# Patient Record
Sex: Male | Born: 1945 | Race: White | Hispanic: No | Smoking: Current every day smoker
Health system: Southern US, Community
[De-identification: ages and names within clinical notes are randomized; demographics above are authoritative.]

## PROBLEM LIST (undated history)

## (undated) DIAGNOSIS — K228 Other specified diseases of esophagus: Secondary | ICD-10-CM

## (undated) DIAGNOSIS — E785 Hyperlipidemia, unspecified: Secondary | ICD-10-CM

## (undated) DIAGNOSIS — I10 Essential (primary) hypertension: Secondary | ICD-10-CM

## (undated) DIAGNOSIS — C4491 Basal cell carcinoma of skin, unspecified: Secondary | ICD-10-CM

## (undated) HISTORY — PX: MOHS SURGERY: SUR867

---

## 2011-05-13 DIAGNOSIS — E785 Hyperlipidemia, unspecified: Secondary | ICD-10-CM | POA: Insufficient documentation

## 2011-05-13 DIAGNOSIS — Z720 Tobacco use: Secondary | ICD-10-CM | POA: Insufficient documentation

## 2012-02-18 DIAGNOSIS — Z85828 Personal history of other malignant neoplasm of skin: Secondary | ICD-10-CM | POA: Insufficient documentation

## 2015-06-21 DIAGNOSIS — N5201 Erectile dysfunction due to arterial insufficiency: Secondary | ICD-10-CM | POA: Insufficient documentation

## 2015-10-24 DIAGNOSIS — Z0000 Encounter for general adult medical examination without abnormal findings: Secondary | ICD-10-CM | POA: Insufficient documentation

## 2015-12-26 ENCOUNTER — Encounter: Payer: Self-pay | Admitting: *Deleted

## 2015-12-27 ENCOUNTER — Ambulatory Visit: Payer: Medicare Other | Admitting: Anesthesiology

## 2015-12-27 ENCOUNTER — Encounter
Admission: RE | Disposition: A | Discharge status: Home / Self Care | Payer: Self-pay | Source: Ambulatory Visit | Attending: Unknown Physician Specialty

## 2015-12-27 ENCOUNTER — Encounter: Payer: Self-pay | Admitting: *Deleted

## 2015-12-27 ENCOUNTER — Ambulatory Visit
Admission: RE | Admit: 2015-12-27 | Discharge: 2015-12-27 | Disposition: A | Discharge status: Home / Self Care | Payer: Medicare Other | Source: Ambulatory Visit | Attending: Unknown Physician Specialty | Admitting: Unknown Physician Specialty

## 2015-12-27 DIAGNOSIS — Z1211 Encounter for screening for malignant neoplasm of colon: Secondary | ICD-10-CM | POA: Insufficient documentation

## 2015-12-27 DIAGNOSIS — I10 Essential (primary) hypertension: Secondary | ICD-10-CM | POA: Diagnosis not present

## 2015-12-27 DIAGNOSIS — Z85828 Personal history of other malignant neoplasm of skin: Secondary | ICD-10-CM | POA: Diagnosis not present

## 2015-12-27 DIAGNOSIS — Z79899 Other long term (current) drug therapy: Secondary | ICD-10-CM | POA: Insufficient documentation

## 2015-12-27 DIAGNOSIS — K5730 Diverticulosis of large intestine without perforation or abscess without bleeding: Secondary | ICD-10-CM | POA: Insufficient documentation

## 2015-12-27 DIAGNOSIS — K640 First degree hemorrhoids: Secondary | ICD-10-CM | POA: Diagnosis not present

## 2015-12-27 DIAGNOSIS — Z7982 Long term (current) use of aspirin: Secondary | ICD-10-CM | POA: Diagnosis not present

## 2015-12-27 DIAGNOSIS — F17200 Nicotine dependence, unspecified, uncomplicated: Secondary | ICD-10-CM | POA: Insufficient documentation

## 2015-12-27 DIAGNOSIS — K621 Rectal polyp: Secondary | ICD-10-CM | POA: Diagnosis not present

## 2015-12-27 DIAGNOSIS — E785 Hyperlipidemia, unspecified: Secondary | ICD-10-CM | POA: Diagnosis not present

## 2015-12-27 DIAGNOSIS — R011 Cardiac murmur, unspecified: Secondary | ICD-10-CM | POA: Diagnosis not present

## 2015-12-27 HISTORY — DX: Basal cell carcinoma of skin, unspecified: C44.91

## 2015-12-27 HISTORY — PX: COLONOSCOPY WITH PROPOFOL: SHX5780

## 2015-12-27 HISTORY — DX: Hyperlipidemia, unspecified: E78.5

## 2015-12-27 HISTORY — DX: Essential (primary) hypertension: I10

## 2015-12-27 SURGERY — COLONOSCOPY WITH PROPOFOL
Anesthesia: General

## 2015-12-27 MED ORDER — SODIUM CHLORIDE 0.9 % IV SOLN
INTRAVENOUS | Status: DC
  Administered 2015-12-27: 08:00:00 via INTRAVENOUS

## 2015-12-27 MED ORDER — PROPOFOL 10 MG/ML IV BOLUS
INTRAVENOUS | Status: DC | PRN
  Administered 2015-12-27: 30 mg via INTRAVENOUS
  Administered 2015-12-27: 20 mg via INTRAVENOUS

## 2015-12-27 MED ORDER — FENTANYL CITRATE (PF) 100 MCG/2ML IJ SOLN
INTRAMUSCULAR | Status: DC | PRN
  Administered 2015-12-27: 50 ug via INTRAVENOUS

## 2015-12-27 MED ORDER — MIDAZOLAM HCL 5 MG/5ML IJ SOLN
INTRAMUSCULAR | Status: DC | PRN
  Administered 2015-12-27: 1 mg via INTRAVENOUS

## 2015-12-27 MED ORDER — SODIUM CHLORIDE 0.9 % IV SOLN: INTRAVENOUS | Status: DC

## 2015-12-27 MED ORDER — LIDOCAINE HCL (PF) 2 % IJ SOLN
INTRAMUSCULAR | Status: DC | PRN
  Administered 2015-12-27: 50 mg

## 2015-12-27 MED ORDER — PROPOFOL 500 MG/50ML IV EMUL
INTRAVENOUS | Status: DC | PRN
  Administered 2015-12-27: 75 ug/kg/min via INTRAVENOUS

## 2015-12-27 NOTE — Transfer of Care (Signed)
Immediate Anesthesia Transfer of Care Note  Patient: Evan Mason  Procedure(s) Performed: Procedure(s): COLONOSCOPY WITH PROPOFOL (N/A)  Patient Location: PACU  Anesthesia Type:General  Level of Consciousness: sedated  Airway & Oxygen Therapy: Patient Spontanous Breathing and Patient connected to nasal cannula oxygen  Post-op Assessment: Report given to RN and Post -op Vital signs reviewed and stable  Post vital signs: Reviewed and stable  Last Vitals:  Filed Vitals:   12/27/15 0742  BP: 127/57  Pulse: 84  Temp: 35.7 C  Resp: 18    Complications: No apparent anesthesia complications

## 2015-12-27 NOTE — H&P (Signed)
   Primary Care Physician:  Kirk Ruths., MD Primary Gastroenterologist:  Dr. Vira Agar  Pre-Procedure History & Physical: HPI:  Evan Mason is a 70 y.o. male is here for an colonoscopy.   Past Medical History  Diagnosis Date  . Hypertension   . Hyperlipidemia   . Basal cell carcinoma     Past Surgical History  Procedure Laterality Date  . Mohs surgery      Prior to Admission medications   Medication Sig Start Date End Date Taking? Authorizing Provider  amLODipine (NORVASC) 10 MG tablet Take 10 mg by mouth daily.   Yes Historical Provider, MD  aspirin 81 MG tablet Take 81 mg by mouth daily.   Yes Historical Provider, MD  hydrochlorothiazide (HYDRODIURIL) 25 MG tablet Take 25 mg by mouth daily.   Yes Historical Provider, MD  lisinopril (PRINIVIL,ZESTRIL) 40 MG tablet Take 40 mg by mouth daily.   Yes Historical Provider, MD  Multiple Vitamins-Minerals (MULTIVITAMIN WITH MINERALS) tablet Take 1 tablet by mouth daily.   Yes Historical Provider, MD  OMEGA-3 FATTY ACIDS PO Take by mouth.   Yes Historical Provider, MD  sildenafil (REVATIO) 20 MG tablet Take 20 mg by mouth 3 (three) times daily.   Yes Historical Provider, MD    Allergies as of 12/20/2015  . (Not on File)    History reviewed. No pertinent family history.  Social History   Social History  . Marital Status: Divorced    Spouse Name: N/A  . Number of Children: N/A  . Years of Education: N/A   Occupational History  . Not on file.   Social History Main Topics  . Smoking status: Current Every Day Smoker  . Smokeless tobacco: Never Used  . Alcohol Use: No  . Drug Use: No  . Sexual Activity: Not on file   Other Topics Concern  . Not on file   Social History Narrative    Review of Systems: See HPI, otherwise negative ROS  Physical Exam: BP 127/57 mmHg  Pulse 84  Temp(Src) 96.2 F (35.7 C) (Tympanic)  Resp 18  Ht 5\' 8"  (1.727 m)  Wt 72.576 kg (160 lb)  BMI 24.33 kg/m2  SpO2  100% General:   Alert,  pleasant and cooperative in NAD Head:  Normocephalic and atraumatic. Neck:  Supple; no masses or thyromegaly. Lungs:  Clear throughout to auscultation.    Heart:  Regular rate and rhythm. Abdomen:  Soft, nontender and nondistended. Normal bowel sounds, without guarding, and without rebound.   Neurologic:  Alert and  oriented x4;  grossly normal neurologically.  Impression/Plan: Evan Mason is here for an colonoscopy to be performed for screening  Risks, benefits, limitations, and alternatives regarding  colonoscopy have been reviewed with the patient.  Questions have been answered.  All parties agreeable.   Gaylyn Cheers, MD  12/27/2015, 8:38 AM

## 2015-12-27 NOTE — Anesthesia Preprocedure Evaluation (Addendum)
Anesthesia Evaluation  Patient identified by MRN, date of birth, ID band Patient awake    Reviewed: Allergy & Precautions, NPO status , Patient's Chart, lab work & pertinent test results  Airway Mallampati: II  TM Distance: >3 FB     Dental  (+) Chipped   Pulmonary Current Smoker,    Pulmonary exam normal        Cardiovascular hypertension, Pt. on medications Normal cardiovascular exam+ Valvular Problems/Murmurs   Heart murmur   Neuro/Psych negative neurological ROS  negative psych ROS   GI/Hepatic negative GI ROS, Neg liver ROS,   Endo/Other  negative endocrine ROS  Renal/GU negative Renal ROS  negative genitourinary   Musculoskeletal negative musculoskeletal ROS (+)   Abdominal Normal abdominal exam  (+)   Peds negative pediatric ROS (+)  Hematology negative hematology ROS (+)   Anesthesia Other Findings Basal cell  Reproductive/Obstetrics                            Anesthesia Physical Anesthesia Plan  ASA: II  Anesthesia Plan: General   Post-op Pain Management:    Induction: Intravenous  Airway Management Planned: Nasal Cannula  Additional Equipment:   Intra-op Plan:   Post-operative Plan:   Informed Consent: I have reviewed the patients History and Physical, chart, labs and discussed the procedure including the risks, benefits and alternatives for the proposed anesthesia with the patient or authorized representative who has indicated his/her understanding and acceptance.   Dental advisory given  Plan Discussed with: CRNA and Surgeon  Anesthesia Plan Comments:         Anesthesia Quick Evaluation

## 2015-12-27 NOTE — Op Note (Signed)
[facility] Gastroenterology Patient Name: Evan Mason Procedure Date: 12/27/2015 8:40 AM MRN: [id number] Account #: [id number] Date of Birth: 03-11-1946 Admit Type: Outpatient Age: 70 Room: [facility] ENDO ROOM 1 Gender: Male Note Status: Finalized Procedure:            Colonoscopy Indications:          Screening for colorectal malignant neoplasm Providers:            Manya Silvas, MD Referring MD:         Ocie Cornfield. Ouida Sills, MD (Referring MD) Medicines:            Propofol per Anesthesia Complications:        No immediate complications. Procedure:            Pre-Anesthesia Assessment:                       - After reviewing the risks and benefits, the patient                        was deemed in satisfactory condition to undergo the                        procedure.                       After obtaining informed consent, the colonoscope was                        passed under direct vision. Throughout the procedure,                        the patient's blood pressure, pulse, and oxygen                        saturations were monitored continuously. The                        Colonoscope was introduced through the anus and                        advanced to the the cecum, identified by appendiceal                        orifice and ileocecal valve. The colonoscopy was                        performed without difficulty. The patient tolerated the                        procedure well. The quality of the bowel preparation                        was excellent. Findings:      A small polyp was found in the rectum. The polyp was sessile. The polyp       was removed with a hot snare. Resection and retrieval were complete.      A few small-mouthed diverticula were found in the sigmoid colon.      Internal hemorrhoids were found during endoscopy. The hemorrhoids were       small and Grade I (internal hemorrhoids that do not prolapse).  The colon was otherwise  normal. Impression:           - One small polyp in the rectum, removed with a hot                        snare. Resected and retrieved.                       - Diverticulosis in the sigmoid colon.                       - Internal hemorrhoids. Recommendation:       - Await pathology results. Manya Silvas, MD 12/27/2015 9:06:29 AM This report has been signed electronically. Number of Addenda: 0 Note Initiated On: 12/27/2015 8:40 AM Scope Withdrawal Time: 0 hours 9 minutes 25 seconds  Total Procedure Duration: 0 hours 14 minutes 57 seconds       [facility]

## 2015-12-27 NOTE — Anesthesia Postprocedure Evaluation (Signed)
Anesthesia Post Note  Patient: Evan Mason  Procedure(s) Performed: Procedure(s) (LRB): COLONOSCOPY WITH PROPOFOL (N/A)  Patient location during evaluation: PACU Anesthesia Type: General Level of consciousness: awake and alert and oriented Pain management: pain level controlled Vital Signs Assessment: post-procedure vital signs reviewed and stable Respiratory status: spontaneous breathing Cardiovascular status: blood pressure returned to baseline Anesthetic complications: no    Last Vitals:  Filed Vitals:   12/27/15 0928 12/27/15 0930  BP: 142/76 130/71  Pulse: 79 74  Temp:    Resp: 19 16    Last Pain: There were no vitals filed for this visit.               Maddyx Wieck

## 2015-12-28 LAB — SURGICAL PATHOLOGY

## 2016-11-07 DIAGNOSIS — R739 Hyperglycemia, unspecified: Secondary | ICD-10-CM | POA: Insufficient documentation

## 2018-01-14 ENCOUNTER — Other Ambulatory Visit: Payer: Self-pay | Admitting: Gastroenterology

## 2018-01-14 DIAGNOSIS — R634 Abnormal weight loss: Secondary | ICD-10-CM

## 2018-01-14 DIAGNOSIS — R1310 Dysphagia, unspecified: Secondary | ICD-10-CM

## 2018-01-15 ENCOUNTER — Ambulatory Visit
Admission: RE | Admit: 2018-01-15 | Discharge: 2018-01-15 | Disposition: A | Discharge status: Home / Self Care | Payer: Medicare HMO | Source: Ambulatory Visit | Attending: Gastroenterology | Admitting: Gastroenterology

## 2018-01-15 DIAGNOSIS — K222 Esophageal obstruction: Secondary | ICD-10-CM | POA: Diagnosis not present

## 2018-01-15 DIAGNOSIS — R1310 Dysphagia, unspecified: Secondary | ICD-10-CM | POA: Insufficient documentation

## 2018-01-15 DIAGNOSIS — R634 Abnormal weight loss: Secondary | ICD-10-CM | POA: Diagnosis present

## 2018-01-19 ENCOUNTER — Other Ambulatory Visit: Payer: Self-pay | Admitting: Gastroenterology

## 2018-01-19 ENCOUNTER — Telehealth: Payer: Self-pay

## 2018-01-19 ENCOUNTER — Institutional Professional Consult (permissible substitution): Payer: Medicare HMO | Admitting: Radiation Oncology

## 2018-01-19 ENCOUNTER — Ambulatory Visit: Payer: Medicare HMO | Admitting: Anesthesiology

## 2018-01-19 ENCOUNTER — Other Ambulatory Visit (HOSPITAL_COMMUNITY): Payer: Self-pay | Admitting: Gastroenterology

## 2018-01-19 ENCOUNTER — Ambulatory Visit
Admission: RE | Admit: 2018-01-19 | Discharge: 2018-01-19 | Disposition: A | Discharge status: Home / Self Care | Payer: Medicare HMO | Source: Ambulatory Visit | Attending: Gastroenterology | Admitting: Gastroenterology

## 2018-01-19 ENCOUNTER — Encounter
Admission: RE | Disposition: A | Discharge status: Home / Self Care | Payer: Self-pay | Source: Ambulatory Visit | Attending: Gastroenterology

## 2018-01-19 ENCOUNTER — Encounter: Payer: Self-pay | Admitting: Emergency Medicine

## 2018-01-19 DIAGNOSIS — I10 Essential (primary) hypertension: Secondary | ICD-10-CM | POA: Diagnosis not present

## 2018-01-19 DIAGNOSIS — Z85828 Personal history of other malignant neoplasm of skin: Secondary | ICD-10-CM | POA: Insufficient documentation

## 2018-01-19 DIAGNOSIS — K228 Other specified diseases of esophagus: Secondary | ICD-10-CM | POA: Insufficient documentation

## 2018-01-19 DIAGNOSIS — Z7982 Long term (current) use of aspirin: Secondary | ICD-10-CM | POA: Diagnosis not present

## 2018-01-19 DIAGNOSIS — Z79899 Other long term (current) drug therapy: Secondary | ICD-10-CM | POA: Diagnosis not present

## 2018-01-19 DIAGNOSIS — R1310 Dysphagia, unspecified: Secondary | ICD-10-CM | POA: Diagnosis present

## 2018-01-19 DIAGNOSIS — F17200 Nicotine dependence, unspecified, uncomplicated: Secondary | ICD-10-CM | POA: Insufficient documentation

## 2018-01-19 DIAGNOSIS — K222 Esophageal obstruction: Secondary | ICD-10-CM | POA: Insufficient documentation

## 2018-01-19 DIAGNOSIS — K2289 Other specified disease of esophagus: Secondary | ICD-10-CM

## 2018-01-19 HISTORY — PX: ESOPHAGOGASTRODUODENOSCOPY (EGD) WITH PROPOFOL: SHX5813

## 2018-01-19 SURGERY — ESOPHAGOGASTRODUODENOSCOPY (EGD) WITH PROPOFOL
Anesthesia: General

## 2018-01-19 MED ORDER — PROPOFOL 500 MG/50ML IV EMUL
INTRAVENOUS | Status: DC | PRN
  Administered 2018-01-19: 200 ug/kg/min via INTRAVENOUS

## 2018-01-19 MED ORDER — LIDOCAINE HCL (CARDIAC) 20 MG/ML IV SOLN
INTRAVENOUS | Status: DC | PRN
  Administered 2018-01-19: 80 mg via INTRAVENOUS

## 2018-01-19 MED ORDER — SODIUM CHLORIDE 0.9 % IV SOLN
INTRAVENOUS | Status: DC
  Administered 2018-01-19: 14:00:00 via INTRAVENOUS

## 2018-01-19 MED ORDER — GLYCOPYRROLATE 0.2 MG/ML IJ SOLN
INTRAMUSCULAR | Status: AC
  Filled 2018-01-19: qty 1

## 2018-01-19 MED ORDER — METOPROLOL TARTRATE 5 MG/5ML IV SOLN
INTRAVENOUS | Status: AC
  Filled 2018-01-19: qty 5

## 2018-01-19 MED ORDER — PROPOFOL 10 MG/ML IV BOLUS
INTRAVENOUS | Status: DC | PRN
  Administered 2018-01-19: 80 mg via INTRAVENOUS

## 2018-01-19 MED ORDER — GLYCOPYRROLATE 0.2 MG/ML IJ SOLN
INTRAMUSCULAR | Status: DC | PRN
  Administered 2018-01-19 (×2): 0.1 mg via INTRAVENOUS

## 2018-01-19 MED ORDER — SODIUM CHLORIDE 0.9 % IV BOLUS
1000.0000 mL | Freq: Once | INTRAVENOUS | Status: AC
  Administered 2018-01-19: 1000 mL via INTRAVENOUS

## 2018-01-19 MED ORDER — PROPOFOL 500 MG/50ML IV EMUL
INTRAVENOUS | Status: AC
  Filled 2018-01-19: qty 50

## 2018-01-19 MED ORDER — LIDOCAINE HCL (PF) 2 % IJ SOLN
INTRAMUSCULAR | Status: AC
  Filled 2018-01-19: qty 10

## 2018-01-19 NOTE — Op Note (Signed)
[facility] Gastroenterology Patient Name: Evan Mason Procedure Date: 01/19/2018 1:52 PM MRN: [id number] Account #: [id number] Date of Birth: [birth year] Admit Type: Outpatient Age: 72 Room: [facility] ENDO ROOM 3 Gender: Male Note Status: Finalized Procedure:            Upper GI endoscopy Providers:            Lollie Sails, MD Referring MD:         Ocie Cornfield. Ouida Sills MD, MD (Referring MD) Medicines:            Monitored Anesthesia Care Complications:        No immediate complications. Procedure:            Pre-Anesthesia Assessment:                       - ASA Grade Assessment: III - A patient with severe                        systemic disease.                       After obtaining informed consent, the endoscope was                        passed under direct vision. Throughout the procedure,                        the patient's blood pressure, pulse, and oxygen                        saturations were monitored continuously. The Endoscope                        was introduced through the mouth, and advanced to the                        second part of duodenum. The upper GI endoscopy was                        performed with moderate difficulty due to a partially                        obstructing mass. Successful completion of the                        procedure was aided by withdrawing the scope and                        replacing with the neonatal endoscope. Findings:      The GIF H190 was introduced to the distal esophagus. There is an       infiltrating mass noted at to 2-3 cm above the region of the GE       junction. I was unable to pass this scope into the gastric vault due to       the partially obstructing mass. This scope was removed and a GIF XP180N       scope was introduced and advanced through the very narrow distal /GEJ       region into the gastric vault and introduced into the duodenal c-loop.       The  duodenum was normal in  appearance. The gastric body, antrum and       fundus appear normal, however there is a fungating mass noted protruding       from the bottom of the GE junction. Multiple biopsies were obtained with       the neonatal biopsy forcep. On removal of the scope there is noted a       very narrowed lumen at the GE junction with the mass extending from the       upper cardia/distal GE junction to several cm above the apparent opening       at the cardia. Biopsies were taken from this area as well.      The patient experianced some short runs of wide complex SVT and multiple       pvc. I had hoped to reintroduce the first scope for larger biopsies, but       since much of the lesion was in the narrow area and seen in retroflex in       the upper cardia, and in light of the change of rhythm, I decided to       hold. Patient remained otherwise stable. 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 Biopsies taken. Recommendation:       - Perform a PET-CT scan of the chest, abdomen and                        pelvis at the next available appointment.                       - Encourage po liquids supplements liberally.                       - Refer to an oncologist at the next available                        appointment. Procedure Code(s):    --- Professional ---                       [phone], Esophagogastroduodenoscopy, flexible, transoral;                        diagnostic, including collection of specimen(s) by                        brushing or washing, when performed (separate procedure) CPT copyright 2016 American [employer] Association. All rights reserved. The codes documented in this report are preliminary and upon coder review may  be revised to meet current compliance requirements. Lollie Sails, MD 01/19/2018 3:01:03 PM This report has been signed electronically. Number of Addenda: 0 Note Initiated On: 01/19/2018 1:52  PM      [facility]

## 2018-01-19 NOTE — Anesthesia Post-op Follow-up Note (Signed)
Anesthesia QCDR form completed.        

## 2018-01-19 NOTE — Anesthesia Preprocedure Evaluation (Signed)
Anesthesia Evaluation  Patient identified by MRN, date of birth, ID band Patient awake    Reviewed: Allergy & Precautions, H&P , NPO status , Patient's Chart, lab work & pertinent test results, reviewed documented beta blocker date and time   Airway Mallampati: II   Neck ROM: full    Dental  (+) Poor Dentition   Pulmonary neg pulmonary ROS, Current Smoker,    Pulmonary exam normal        Cardiovascular hypertension, negative cardio ROS Normal cardiovascular exam Rhythm:regular Rate:Normal     Neuro/Psych negative neurological ROS  negative psych ROS   GI/Hepatic negative GI ROS, Neg liver ROS,   Endo/Other  negative endocrine ROS  Renal/GU negative Renal ROS  negative genitourinary   Musculoskeletal   Abdominal   Peds  Hematology negative hematology ROS (+)   Anesthesia Other Findings Past Medical History: No date: Basal cell carcinoma No date: Hyperlipidemia No date: Hypertension Past Surgical History: 12/27/2015: COLONOSCOPY WITH PROPOFOL; N/A     Comment:  Procedure: COLONOSCOPY WITH PROPOFOL;  Surgeon: Manya Silvas, MD;  Location: [facility] ENDOSCOPY;  Service:               Endoscopy;  Laterality: N/A; No date: MOHS SURGERY BMI    Body Mass Index:  18.75 kg/m     Reproductive/Obstetrics negative OB ROS                             Anesthesia Physical Anesthesia Plan  ASA: III  Anesthesia Plan: General   Post-op Pain Management:    Induction:   PONV Risk Score and Plan:   Airway Management Planned:   Additional Equipment:   Intra-op Plan:   Post-operative Plan:   Informed Consent: I have reviewed the patients History and Physical, chart, labs and discussed the procedure including the risks, benefits and alternatives for the proposed anesthesia with the patient or authorized representative who has indicated his/her understanding and acceptance.    Dental Advisory Given  Plan Discussed with: CRNA  Anesthesia Plan Comments:         Anesthesia Quick Evaluation

## 2018-01-19 NOTE — Transfer of Care (Signed)
Immediate Anesthesia Transfer of Care Note  Patient: Evan Mason  Procedure(s) Performed: ESOPHAGOGASTRODUODENOSCOPY (EGD) WITH PROPOFOL (N/A )  Patient Location: PACU  Anesthesia Type:General  Level of Consciousness: awake  Airway & Oxygen Therapy: Patient Spontanous Breathing  Post-op Assessment: Report given to RN  Post vital signs: stable  Last Vitals:  Vitals Value Taken Time  BP 102/78 01/19/2018  2:52 PM  Temp 36.1 C 01/19/2018  2:52 PM  Pulse 90 01/19/2018  2:52 PM  Resp 20 01/19/2018  2:52 PM  SpO2 100 % 01/19/2018  2:52 PM  Vitals shown include unvalidated device data.  Last Pain:  Vitals:   01/19/18 1452  TempSrc: Tympanic  PainSc:          Complications: No apparent anesthesia complications. Discussed arrhythmias with PACU RN.

## 2018-01-19 NOTE — Telephone Encounter (Signed)
Provided contact information for [employer], significant other. Called and notified of appointments 4/1 starting at 0930 with Dr. Cristela Blue. Oncology Nurse Navigator Documentation  Navigator Location: [facility] (01/19/18 1557)   )Navigator Encounter Type: Telephone (01/19/18 1557) Telephone: Lahoma Crocker Call;Appt Confirmation/Clarification (01/19/18 1557)                                                  Time Spent with Patient: 15 (01/19/18 1557)

## 2018-01-19 NOTE — H&P (Signed)
Outpatient short stay form Pre-procedure 01/19/2018 1:51 PM Evan Sails MD  Primary Physician: Dr. Frazier Richards  Reason for visit: EGD  History of present illness: Patient is a 72 year old male presenting today as above.  The.  The past 2-3 months he has been having increasing difficulties with swallowing.  It seems to hang in the distal esophagus.  He has had about a 40 pound weight loss.  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  Patient takes no aspirin products or blood thinning agent    Current Facility-Administered Medications:  .  0.9 %  sodium chloride infusion, , Intravenous, Continuous, Loistine Simas U, MD .  sodium chloride 0.9 % bolus 1,000 mL, 1,000 mL, Intravenous, Once, Evan Sails, MD, 1,000 mL at 01/19/18 1315  Medications Prior to Admission  Medication Sig Dispense Refill Last Dose  . amLODipine (NORVASC) 10 MG tablet Take 10 mg by mouth daily.   Past Week at Unknown time  . aspirin 81 MG tablet Take 81 mg by mouth daily.   Past Week at Unknown time  . dexlansoprazole (DEXILANT) 60 MG capsule Take 60 mg by mouth daily.   Past Week at Unknown time  . hydrochlorothiazide (HYDRODIURIL) 25 MG tablet Take 25 mg by mouth daily.   Past Week at Unknown time  . lisinopril (PRINIVIL,ZESTRIL) 40 MG tablet Take 40 mg by mouth daily.   Past Week at Unknown time  . Multiple Vitamins-Minerals (MULTIVITAMIN WITH MINERALS) tablet Take 1 tablet by mouth daily.   Past Week at Unknown time  . OMEGA-3 FATTY ACIDS PO Take by mouth.   Past Week at Unknown time  . sildenafil (REVATIO) 20 MG tablet Take 20 mg by mouth 3 (three) times daily.   Past Week at Unknown time     No Known Allergies   Past Medical History:  Diagnosis Date  . Basal cell carcinoma   . Hyperlipidemia   . Hypertension     Review of systems:       Physical Exam    Heart and lungs: Regular rate and rhythm without rub or gallop lungs are bilaterally clear    HEENT: Normocephalic atraumatic eyes are anicteric    Other:    Pertinant exam for procedure: Soft nontender nondistended bowel sounds positive normoactive    Planned proceedures: EGD and indicated procedures. I have discussed the risks benefits and complications of procedures to include not limited to bleeding, infection, perforation and the risk of sedation and the patient wishes to proceed.    Evan Sails, MD Gastroenterology 01/19/2018  1:51 PM

## 2018-01-19 NOTE — Telephone Encounter (Signed)
Received call from Dr. Gustavo Lah. Mr. Sison is currently in Endo. Endo revealed fungating/infiltrating mass at the GE junction, near complete obstructing. Referral entered for medical/radiation oncology. Dr. Gustavo Lah will order PET to expedite care. Oncology Nurse Navigator Documentation  Navigator Location: [facility] (01/19/18 1500)   )Navigator Encounter Type: Telephone;Diagnostic Results (01/19/18 1500) Telephone: Incoming Call;Diagnostic Results (01/19/18 1500) Abnormal Finding Date: 01/19/18 (01/19/18 1500)             Multidisiplinary Clinic Type: GI (01/19/18 1500)   Patient Visit Type: Other (01/19/18 1500)                              Time Spent with Patient: 15 (01/19/18 1500)

## 2018-01-20 ENCOUNTER — Other Ambulatory Visit: Payer: Self-pay | Admitting: Pathology

## 2018-01-20 ENCOUNTER — Encounter
Admission: RE | Admit: 2018-01-20 | Discharge: 2018-01-20 | Disposition: A | Discharge status: Home / Self Care | Payer: Medicare HMO | Source: Ambulatory Visit | Attending: Gastroenterology | Admitting: Gastroenterology

## 2018-01-20 DIAGNOSIS — K229 Disease of esophagus, unspecified: Secondary | ICD-10-CM | POA: Insufficient documentation

## 2018-01-20 DIAGNOSIS — K2289 Other specified disease of esophagus: Secondary | ICD-10-CM

## 2018-01-20 DIAGNOSIS — K228 Other specified diseases of esophagus: Secondary | ICD-10-CM

## 2018-01-20 HISTORY — DX: Other specified diseases of esophagus: K22.8

## 2018-01-20 HISTORY — DX: Other specified disease of esophagus: K22.89

## 2018-01-20 LAB — SURGICAL PATHOLOGY

## 2018-01-20 LAB — GLUCOSE, CAPILLARY: GLUCOSE-CAPILLARY: 88 mg/dL (ref 65–99)

## 2018-01-20 MED ORDER — FLUDEOXYGLUCOSE F - 18 (FDG) INJECTION
6.6000 | Freq: Once | INTRAVENOUS | Status: AC | PRN
  Administered 2018-01-20: 6.8 via INTRAVENOUS

## 2018-01-20 NOTE — Anesthesia Postprocedure Evaluation (Signed)
Anesthesia Post Note  Patient: Evan Mason  Procedure(s) Performed: ESOPHAGOGASTRODUODENOSCOPY (EGD) WITH PROPOFOL (N/A )  Patient location during evaluation: Endoscopy Anesthesia Type: General Level of consciousness: awake and alert Pain management: pain level controlled Vital Signs Assessment: post-procedure vital signs reviewed and stable Respiratory status: spontaneous breathing, nonlabored ventilation and respiratory function stable Cardiovascular status: blood pressure returned to baseline and stable Postop Assessment: no apparent nausea or vomiting Anesthetic complications: no Comments: Pt had PVC intraprocedure - stable     Last Vitals:  Vitals:   01/19/18 1453 01/19/18 1503  BP: 133/86 (!) 145/108  Pulse:  100  Resp: 20 11  Temp:    SpO2:  100%    Last Pain:  Vitals:   01/19/18 1503  TempSrc:   PainSc: 0-No pain                 Alphonsus Sias

## 2018-01-21 ENCOUNTER — Telehealth: Payer: Self-pay

## 2018-01-21 DIAGNOSIS — C787 Secondary malignant neoplasm of liver and intrahepatic bile duct: Secondary | ICD-10-CM

## 2018-01-21 DIAGNOSIS — K2289 Other specified disease of esophagus: Secondary | ICD-10-CM

## 2018-01-21 DIAGNOSIS — K228 Other specified diseases of esophagus: Secondary | ICD-10-CM

## 2018-01-21 NOTE — Telephone Encounter (Signed)
Called and spoke with Evan Mason. Notified of biopsy results and PET results. Case presented at tumor board today. Recommended U/S guided liver biopsy. Orders placed and Evan Mason is agreeable. Spoke with Dr. Gustavo Lah regarding plan of care. He is concerned regarding obstruction. If Evan Mason needs PEG it will need to be placed in IR under flouro. I will notify Dr. Tasia Catchings regarding.  DIAGNOSIS:  A. MASS, UPPER CARDIA/GE JUNCTION; COLD BIOPSY:  - AT LEAST HIGH-GRADE DYSPLASIA, CANNOT EXCLUDE ADENOCARCINOMA IN THIS  VERY SMALL SAMPLE.    IMPRESSION: 1. Hypermetabolic mass in the distal esophagus and adjacent stomach, SUV 13.8, compatible with malignancy. There are over 10 scattered hypermetabolic masses in the liver compatible with widespread hepatic metastatic disease. 2. 8 mm notably cavitary right lower lobe pulmonary nodule is faintly hypermetabolic. Although the maximum SUV is only 1.1, the wall thickness of this lesion is only about 2 mm and accordingly the activity is felt to be abnormal relative to the size of the lesion. This could represent a solitary metastatic lesion to the lung, or a cavitary infectious/inflammatory process.  Oncology Nurse Navigator Documentation  Navigator Location: [facility] (01/21/18 1500)   )Navigator Encounter Type: Diagnostic Results (01/21/18 1500) Telephone: Jerauld Call (01/21/18 1500)                                                  Time Spent with Patient: 30 (01/21/18 1500)

## 2018-01-21 NOTE — Progress Notes (Signed)
Invasive checklist faxed to specialty scheduling with confirmation of receipt. Oncology Nurse Navigator Documentation  Navigator Location: [facility] (01/21/18 1600)   )Navigator Encounter Type: Letter/Fax/Email (01/21/18 1600)                                                    Time Spent with Patient: 15 (01/21/18 1600)

## 2018-01-25 ENCOUNTER — Encounter: Payer: Self-pay | Admitting: Oncology

## 2018-01-25 ENCOUNTER — Encounter: Payer: Self-pay | Admitting: Radiation Oncology

## 2018-01-25 ENCOUNTER — Other Ambulatory Visit: Payer: Self-pay

## 2018-01-25 ENCOUNTER — Ambulatory Visit
Admission: RE | Admit: 2018-01-25 | Discharge: 2018-01-25 | Disposition: A | Discharge status: Home / Self Care | Payer: Medicare HMO | Source: Ambulatory Visit | Attending: Radiation Oncology | Admitting: Radiation Oncology

## 2018-01-25 ENCOUNTER — Inpatient Hospital Stay: Payer: Medicare HMO | Attending: Oncology | Admitting: Oncology

## 2018-01-25 ENCOUNTER — Other Ambulatory Visit: Payer: Self-pay | Admitting: Radiology

## 2018-01-25 ENCOUNTER — Inpatient Hospital Stay: Payer: Medicare HMO

## 2018-01-25 VITALS — Wt 121.9 lb

## 2018-01-25 VITALS — BP 126/71 | HR 80 | Temp 97.8°F | Resp 12 | Ht 68.0 in | Wt 121.1 lb

## 2018-01-25 DIAGNOSIS — R634 Abnormal weight loss: Secondary | ICD-10-CM | POA: Diagnosis not present

## 2018-01-25 DIAGNOSIS — R6881 Early satiety: Secondary | ICD-10-CM

## 2018-01-25 DIAGNOSIS — C155 Malignant neoplasm of lower third of esophagus: Secondary | ICD-10-CM

## 2018-01-25 DIAGNOSIS — R918 Other nonspecific abnormal finding of lung field: Secondary | ICD-10-CM | POA: Diagnosis not present

## 2018-01-25 DIAGNOSIS — Z79899 Other long term (current) drug therapy: Secondary | ICD-10-CM | POA: Insufficient documentation

## 2018-01-25 DIAGNOSIS — E119 Type 2 diabetes mellitus without complications: Secondary | ICD-10-CM | POA: Diagnosis not present

## 2018-01-25 DIAGNOSIS — R410 Disorientation, unspecified: Secondary | ICD-10-CM | POA: Insufficient documentation

## 2018-01-25 DIAGNOSIS — C787 Secondary malignant neoplasm of liver and intrahepatic bile duct: Secondary | ICD-10-CM | POA: Insufficient documentation

## 2018-01-25 DIAGNOSIS — Z931 Gastrostomy status: Secondary | ICD-10-CM | POA: Diagnosis not present

## 2018-01-25 DIAGNOSIS — R52 Pain, unspecified: Secondary | ICD-10-CM

## 2018-01-25 DIAGNOSIS — R8290 Unspecified abnormal findings in urine: Secondary | ICD-10-CM | POA: Diagnosis not present

## 2018-01-25 DIAGNOSIS — R160 Hepatomegaly, not elsewhere classified: Secondary | ICD-10-CM | POA: Diagnosis not present

## 2018-01-25 DIAGNOSIS — Z5111 Encounter for antineoplastic chemotherapy: Secondary | ICD-10-CM | POA: Insufficient documentation

## 2018-01-25 DIAGNOSIS — F0390 Unspecified dementia without behavioral disturbance: Secondary | ICD-10-CM | POA: Diagnosis not present

## 2018-01-25 DIAGNOSIS — F17210 Nicotine dependence, cigarettes, uncomplicated: Secondary | ICD-10-CM | POA: Insufficient documentation

## 2018-01-25 DIAGNOSIS — R1310 Dysphagia, unspecified: Secondary | ICD-10-CM | POA: Insufficient documentation

## 2018-01-25 DIAGNOSIS — K2289 Other specified disease of esophagus: Secondary | ICD-10-CM

## 2018-01-25 DIAGNOSIS — E785 Hyperlipidemia, unspecified: Secondary | ICD-10-CM | POA: Diagnosis not present

## 2018-01-25 DIAGNOSIS — I10 Essential (primary) hypertension: Secondary | ICD-10-CM | POA: Diagnosis not present

## 2018-01-25 DIAGNOSIS — K228 Other specified diseases of esophagus: Secondary | ICD-10-CM

## 2018-01-25 DIAGNOSIS — F688 Other specified disorders of adult personality and behavior: Secondary | ICD-10-CM | POA: Insufficient documentation

## 2018-01-25 DIAGNOSIS — R4182 Altered mental status, unspecified: Secondary | ICD-10-CM | POA: Diagnosis not present

## 2018-01-25 DIAGNOSIS — I2510 Atherosclerotic heart disease of native coronary artery without angina pectoris: Secondary | ICD-10-CM | POA: Insufficient documentation

## 2018-01-25 DIAGNOSIS — Z85828 Personal history of other malignant neoplasm of skin: Secondary | ICD-10-CM | POA: Diagnosis not present

## 2018-01-25 DIAGNOSIS — R911 Solitary pulmonary nodule: Secondary | ICD-10-CM | POA: Insufficient documentation

## 2018-01-25 DIAGNOSIS — E43 Unspecified severe protein-calorie malnutrition: Secondary | ICD-10-CM | POA: Insufficient documentation

## 2018-01-25 DIAGNOSIS — R5381 Other malaise: Secondary | ICD-10-CM | POA: Diagnosis not present

## 2018-01-25 DIAGNOSIS — Z7982 Long term (current) use of aspirin: Secondary | ICD-10-CM

## 2018-01-25 DIAGNOSIS — C159 Malignant neoplasm of esophagus, unspecified: Secondary | ICD-10-CM

## 2018-01-25 DIAGNOSIS — R5383 Other fatigue: Secondary | ICD-10-CM | POA: Diagnosis not present

## 2018-01-25 DIAGNOSIS — I700 Atherosclerosis of aorta: Secondary | ICD-10-CM | POA: Insufficient documentation

## 2018-01-25 DIAGNOSIS — R070 Pain in throat: Secondary | ICD-10-CM | POA: Diagnosis not present

## 2018-01-25 DIAGNOSIS — Z7189 Other specified counseling: Secondary | ICD-10-CM

## 2018-01-25 LAB — CBC WITH DIFFERENTIAL/PLATELET
BASOS PCT: 1 %
Basophils Absolute: 0.1 10*3/uL (ref 0–0.1)
EOS ABS: 0.1 10*3/uL (ref 0–0.7)
EOS PCT: 1 %
HCT: 41.2 % (ref 40.0–52.0)
HEMOGLOBIN: 14.2 g/dL (ref 13.0–18.0)
Lymphocytes Relative: 16 %
Lymphs Abs: 1.3 10*3/uL (ref 1.0–3.6)
MCH: 30.8 pg (ref 26.0–34.0)
MCHC: 34.4 g/dL (ref 32.0–36.0)
MCV: 89.5 fL (ref 80.0–100.0)
MONOS PCT: 8 %
Monocytes Absolute: 0.6 10*3/uL (ref 0.2–1.0)
NEUTROS PCT: 74 %
Neutro Abs: 5.8 10*3/uL (ref 1.4–6.5)
PLATELETS: 348 10*3/uL (ref 150–440)
RBC: 4.6 MIL/uL (ref 4.40–5.90)
RDW: 14.1 % (ref 11.5–14.5)
WBC: 7.9 10*3/uL (ref 3.8–10.6)

## 2018-01-25 LAB — COMPREHENSIVE METABOLIC PANEL
ALK PHOS: 195 U/L — AB (ref 38–126)
ALT: 30 U/L (ref 17–63)
AST: 34 U/L (ref 15–41)
Albumin: 4.3 g/dL (ref 3.5–5.0)
Anion gap: 10 (ref 5–15)
BUN: 35 mg/dL — AB (ref 6–20)
CALCIUM: 9.5 mg/dL (ref 8.9–10.3)
CO2: 25 mmol/L (ref 22–32)
CREATININE: 0.94 mg/dL (ref 0.61–1.24)
Chloride: 101 mmol/L (ref 101–111)
Glucose, Bld: 75 mg/dL (ref 65–99)
Potassium: 4.3 mmol/L (ref 3.5–5.1)
Sodium: 136 mmol/L (ref 135–145)
Total Bilirubin: 0.7 mg/dL (ref 0.3–1.2)
Total Protein: 7.2 g/dL (ref 6.5–8.1)

## 2018-01-25 LAB — PREALBUMIN: Prealbumin: 24 mg/dL (ref 18–38)

## 2018-01-25 LAB — PROTIME-INR
INR: 0.96
PROTHROMBIN TIME: 12.7 s (ref 11.4–15.2)

## 2018-01-25 LAB — APTT: APTT: 30 s (ref 24–36)

## 2018-01-25 MED ORDER — FENTANYL 25 MCG/HR TD PT72: 25.0000 ug | MEDICATED_PATCH | TRANSDERMAL | 0 refills | Status: AC

## 2018-01-25 NOTE — Progress Notes (Signed)
Met with Evan Mason, his daugther, Evan Mason, and his significant other, Evan Mason. Reviewed endoscopy biopsy results and PET results. Copy of results given. Multiple appointments made and reviewed. All appointments provided in AVS. Went over instructions for biopsy. Arrival 1030 for 1130 appt.at the medical mall entrance. Registration desk on the right.Do not eat or drink anything after midnight. He is not currently taking any medication due to inability to swallow them. Pain medication to be sent by Dr. Tasia Catchings following resulted labs.   Oncology Nurse Navigator Documentation  Navigator Location: [facility] (01/25/18 1300)   )Navigator Encounter Type: Initial MedOnc;Initial RadOnc (01/25/18 1300)   Abnormal Finding Date: 01/19/18 (01/25/18 1300)             Multidisiplinary Clinic Type: GI (01/25/18 1300)   Patient Visit Type: MedOnc;RadOnc;Initial (01/25/18 1300)   Barriers/Navigation Needs: Transportation;Education;Coordination of Care (01/25/18 1300)   Interventions: Coordination of Care;Education;Transportation (01/25/18 1300)                      Time Spent with Patient: > 120 (01/25/18 1300)

## 2018-01-25 NOTE — Consult Note (Signed)
NEW PATIENT EVALUATION  Name: Evan Mason  MRN: [id number]  Date:   01/25/2018     DOB: [birth year]   This 72 y.o. male patient presents to the clinic for initial evaluation of stage IV GE junction esophageal carcinoma.  REFERRING PHYSICIAN: Kirk Ruths, MD  CHIEF COMPLAINT:  Chief Complaint  Patient presents with  . Esophageal Cancer    DIAGNOSIS: The encounter diagnosis was Malignant neoplasm of esophagus, unspecified location  [facility]).   PREVIOUS INVESTIGATIONS:  PET CT scan reviewed Pathology report reviewed Clinical notes reviewed  HPI: patient is a 72 year old male who for the past 2-3 months has been having increasing difficulty swallowing now almost 2 inability to swallow his own saliva. He is had a 40 pound weight loss over this period of time. He recently had upper endoscopy showing a 2.5 cm irregular mass at the GE junction with almost totalocclusion. Biopsy was performed showing at least high-grade dysplasia with adenocarcinoma cannot be excluded based on the small sample size. He is being scheduled for repeat biopsy. His PET/CT scan demonstrates hypermetabolic activity with SUV of 13.8 in the distal esophagus with multiple hypermetabolic masses in liver compatible with widespread metastatic disease to his liver. He also is a 14mm lesion in his right lower lobe which may be metastatic disease.he is seen todayaccompanied by his family for discussion of possible palliative treatment to his esophagus. Patient seems to have early dementia is somewhat uncooperative. He specifically denies pain today. Continues to have significant dysphasia even to liquids.  PLANNED TREATMENT REGIMEN: palliative I am RT radiation therapy to his esophagus  PAST MEDICAL HISTORY:  has a past medical history of Basal cell carcinoma, Esophageal mass (01/20/2018), Hyperlipidemia, and Hypertension.    PAST SURGICAL HISTORY:  Past Surgical History:  Procedure Laterality Date  . COLONOSCOPY WITH  PROPOFOL N/A 12/27/2015   Procedure: COLONOSCOPY WITH PROPOFOL;  Surgeon: Manya Silvas, MD;  Location: [facility] ENDOSCOPY;  Service: Endoscopy;  Laterality: N/A;  . ESOPHAGOGASTRODUODENOSCOPY (EGD) WITH PROPOFOL N/A 01/19/2018   Procedure: ESOPHAGOGASTRODUODENOSCOPY (EGD) WITH PROPOFOL;  Surgeon: Lollie Sails, MD;  Location: [facility] ENDOSCOPY;  Service: Endoscopy;  Laterality: N/A;  . MOHS SURGERY      FAMILY HISTORY: family history is not on file.  SOCIAL HISTORY:  reports that he has been smoking cigarettes.  He has been smoking about 0.25 packs per day. He has never used smokeless tobacco. He reports that he does not drink alcohol or use drugs.  ALLERGIES: Patient has no known allergies.  MEDICATIONS:  Current Outpatient Medications  Medication Sig Dispense Refill  . amLODipine (NORVASC) 10 MG tablet Take 10 mg by mouth daily.    Marland Kitchen aspirin 81 MG tablet Take 81 mg by mouth daily.    Marland Kitchen dexlansoprazole (DEXILANT) 60 MG capsule Take 60 mg by mouth daily.    . hydrochlorothiazide (HYDRODIURIL) 25 MG tablet Take 25 mg by mouth daily.    Marland Kitchen lisinopril (PRINIVIL,ZESTRIL) 40 MG tablet Take 40 mg by mouth daily.    . Multiple Vitamins-Minerals (MULTIVITAMIN WITH MINERALS) tablet Take 1 tablet by mouth daily.    . OMEGA-3 FATTY ACIDS PO Take by mouth.    . sildenafil (REVATIO) 20 MG tablet Take 20 mg by mouth 3 (three) times daily.     No current facility-administered medications for this encounter.     ECOG PERFORMANCE STATUS:  1 - Symptomatic but completely ambulatory  REVIEW OF SYSTEMS: except for the continued weight loss and difficulty swallowing Patient denies any weight  loss, fatigue, weakness, fever, chills or night sweats. Patient denies any loss of vision, blurred vision. Patient denies any ringing  of the ears or hearing loss. No irregular heartbeat. Patient denies heart murmur or history of fainting. Patient denies any chest pain or pain radiating to her upper extremities. Patient  denies any shortness of breath, difficulty breathing at night, cough or hemoptysis. Patient denies any swelling in the lower legs. Patient denies any nausea vomiting, vomiting of blood, or coffee ground material in the vomitus. Patient denies any stomach pain. Patient states has had normal bowel movements no significant constipation or diarrhea. Patient denies any dysuria, hematuria or significant nocturia. Patient denies any problems walking, swelling in the joints or loss of balance. Patient denies any skin changes, loss of hair or loss of weight. Patient denies any excessive worrying or anxiety or significant depression. Patient denies any problems with insomnia. Patient denies excessive thirst, polyuria, polydipsia. Patient denies any swollen glands, patient denies easy bruising or easy bleeding. Patient denies any recent infections, allergies or URI. Patient "s visual fields have not changed significantly in recent time.    PHYSICAL EXAM: BP 126/71 (BP Location: Left Arm, Patient Position: Sitting, Cuff Size: Small)   Pulse 80   Temp 97.8 F (36.6 C) (Tympanic)   Resp 12   Ht 5\' 8"  (1.727 m)   Wt 121 lb 2.3 oz (54.9 kg)   SpO2 100%   BMI 18.42 kg/m  Thin slightly cachectic male in NAD. Well-developed well-nourished patient in NAD. HEENT reveals PERLA, EOMI, discs not visualized.  Oral cavity is clear. No oral mucosal lesions are identified. Neck is clear without evidence of cervical or supraclavicular adenopathy. Lungs are clear to A&P. Cardiac examination is essentially unremarkable with regular rate and rhythm without murmur rub or thrill. Abdomen is benign with no organomegaly or masses noted. Motor sensory and DTR levels are equal and symmetric in the upper and lower extremities. Cranial nerves II through XII are grossly intact. Proprioception is intact. No peripheral adenopathy or edema is identified. No motor or sensory levels are noted. Crude visual fields are within normal  range.  LABORATORY DATA: pathology reports reviewed    RADIOLOGY RESULTS:PET CT scan reviewed   IMPRESSION: stage IV adenocarcinoma the distal esophagus in 72 year old male with almost complete esophageal obstruction secondary to tumor.  PLAN: at this time like to go ahead with palliative radiation therapy to his distal esophagus. Would plan on delivering 5400 cGy over 28 fractions using I am RT radiation therapy treatment planning and delivery. I would choose I am RT to spare structures such as his left ventricle as well as decreasing exposure to the esophagus spinal cord and normal lung volume. Risks and benefits of treatment including exacerbation of worsening of his dysphasia fatigue alteration of blood counts skin reaction all were discussed in detail. I personally set up and ordered CT simulation for later this week. He is also seeing medical oncology for consideration of chemotherapy.There will be extra effort by both professional staff as well as technical staff to coordinate and manage concurrent chemoradiation and ensuing side effects during his treatments.  Patient family both seem to compress my treatment plan well.  I would like to take this opportunity to thank you for allowing me to participate in the care of your patient.Noreene Filbert, MD

## 2018-01-25 NOTE — Progress Notes (Signed)
Hematology/Oncology Consult note [facility] Telephone:([phone] [phone]   Patient Care Team: Kirk Ruths, MD as PCP - General (Internal Medicine) Clent Jacks, RN as Registered Nurse  REFERRING PROVIDER: Kirk Ruths, MD  Lollie Sails MD   CHIEF COMPLAINTS/PURPOSE OF CONSULTATION:  Evaluation of Esophageal mass  HISTORY OF PRESENTING ILLNESS:  Evan Mason is a  72 y.o.  male with PMH listed below who was referred to me for evaluation of Esophageal mass. Patient recently presented emergency room of swallowing 40 pound weight loss. 01/19/2018 upper endoscopy showedFungating/infiltrating mass at the GE junction, near complete obstructing. Biopsies taken. Biopsy showed at least high-grade dysplasia, cannot exclude adenocarcinoma in this very small sample. Patient has had a PET scan done which showed hypermetabolic mass in the distal esophagus and adjacent stomach, SUV 13.8, patible with malignancy.  There are over 10 scattered hypermetabolic masses in the liver compatible with widespread hepatic metastatic disease.  There is also 8 mm multiple cavitary right lower lobe pulmonary nodule is faintly hypermetabolic.  Patient is accompanied by his wife and a daughter to clinic today.  Patient does not talk much.  He reports able to drink 2-3 Ensure drinks every day.  But not able to eat any solid food.  His wife reports that he does not drink adequate fluid as well.  He reports gagging is oral secretions Wife reports that patient seems to have had some mood/personality changes recently as well.  Patient denies any headache or double vision at this point. He reports 10 out of 10 pain diffuse pain all over his body, not able to describe the nature, specified location of his pain.   Review of Systems  Constitutional: Positive for malaise/fatigue and weight loss. Negative for chills and fever.  HENT: Negative for ear discharge  and hearing loss.   Eyes: Negative for double vision.  Respiratory: Negative for cough and sputum production.   Cardiovascular: Negative for chest pain and orthopnea.  Gastrointestinal: Negative for blood in stool, diarrhea and nausea.  Genitourinary: Negative for dysuria.  Musculoskeletal: Negative for myalgias.  Skin: Negative for rash.  Neurological: Negative for dizziness and tremors.  Endo/Heme/Allergies: Does not bruise/bleed easily.  Psychiatric/Behavioral: Negative for depression. The patient is not nervous/anxious.        Flat affect    MEDICAL HISTORY:  Past Medical History:  Diagnosis Date  . Basal cell carcinoma   . Esophageal mass 01/20/2018   From PET scan order  . Hyperlipidemia   . Hypertension     SURGICAL HISTORY: Past Surgical History:  Procedure Laterality Date  . COLONOSCOPY WITH PROPOFOL N/A 12/27/2015   Procedure: COLONOSCOPY WITH PROPOFOL;  Surgeon: Manya Silvas, MD;  Location: [facility] ENDOSCOPY;  Service: Endoscopy;  Laterality: N/A;  . ESOPHAGOGASTRODUODENOSCOPY (EGD) WITH PROPOFOL N/A 01/19/2018   Procedure: ESOPHAGOGASTRODUODENOSCOPY (EGD) WITH PROPOFOL;  Surgeon: Lollie Sails, MD;  Location: [facility] ENDOSCOPY;  Service: Endoscopy;  Laterality: N/A;  . MOHS SURGERY      SOCIAL HISTORY: Social History   Socioeconomic History  . Marital status: Divorced    Spouse name: Not on file  . Number of children: Not on file  . Years of education: Not on file  . Highest education level: Not on file  Occupational History  . Not on file  Social Needs  . Financial resource strain: Not on file  . Food insecurity:    Worry: Not on file    Inability: Not on file  .  Transportation needs:    Medical: Not on file    Non-medical: Not on file  Tobacco Use  . Smoking status: Current Every Day Smoker    Packs/day: 0.25    Types: Cigarettes  . Smokeless tobacco: Never Used  Substance and Sexual Activity  . Alcohol use: No  . Drug use: No  . Sexual  activity: Not on file  Lifestyle  . Physical activity:    Days per week: Not on file    Minutes per session: Not on file  . Stress: Not on file  Relationships  . Social connections:    Talks on phone: Not on file    Gets together: Not on file    Attends religious service: Not on file    Active member of club or organization: Not on file    Attends meetings of clubs or organizations: Not on file    Relationship status: Not on file  . Intimate partner violence:    Fear of current or ex partner: Not on file    Emotionally abused: Not on file    Physically abused: Not on file    Forced sexual activity: Not on file  Other Topics Concern  . Not on file  Social History Narrative  . Not on file    FAMILY HISTORY: No family history on file.  ALLERGIES:  has No Known Allergies.  MEDICATIONS:  Current Outpatient Medications  Medication Sig Dispense Refill  . amLODipine (NORVASC) 10 MG tablet Take 10 mg by mouth daily.    Marland Kitchen aspirin 81 MG tablet Take 81 mg by mouth daily.    Marland Kitchen dexlansoprazole (DEXILANT) 60 MG capsule Take 60 mg by mouth daily.    . fentaNYL (DURAGESIC - DOSED MCG/HR) 25 MCG/HR patch Place 1 patch (25 mcg total) onto the skin every 3 (three) days. 5 patch 0  . hydrochlorothiazide (HYDRODIURIL) 25 MG tablet Take 25 mg by mouth daily.    Marland Kitchen lisinopril (PRINIVIL,ZESTRIL) 40 MG tablet Take 40 mg by mouth daily.    . Multiple Vitamins-Minerals (MULTIVITAMIN WITH MINERALS) tablet Take 1 tablet by mouth daily.    . OMEGA-3 FATTY ACIDS PO Take by mouth.    . sildenafil (REVATIO) 20 MG tablet Take 20 mg by mouth 3 (three) times daily.     No current facility-administered medications for this visit.      PHYSICAL EXAMINATION: ECOG PERFORMANCE STATUS: 1 - Symptomatic but completely ambulatory There were no vitals filed for this visit. Filed Weights   01/25/18 1125  Weight: 121 lb 14.4 oz (55.3 kg)    Physical Exam  Constitutional: He is oriented to person, place, and  time and well-developed, well-nourished, and in no distress. No distress.  HENT:  Head: Normocephalic and atraumatic.  Eyes: EOM are normal.  Neck: Normal range of motion. Neck supple. No thyromegaly present.  Cardiovascular: Normal rate and regular rhythm.  No murmur heard. Pulmonary/Chest: Effort normal and breath sounds normal.  Abdominal: Soft. Bowel sounds are normal. There is no rebound.  Musculoskeletal: Normal range of motion.  Neurological: He is alert and oriented to person, place, and time.  Skin: Skin is warm and dry.  Psychiatric: Affect and judgment normal.     LABORATORY DATA:  I have reviewed the data as listed Lab Results  Component Value Date   WBC 7.9 01/25/2018   HGB 14.2 01/25/2018   HCT 41.2 01/25/2018   MCV 89.5 01/25/2018   PLT 348 01/25/2018   Recent Labs  01/25/18 1248  NA 136  K 4.3  CL 101  CO2 25  GLUCOSE 75  BUN 35*  CREATININE 0.94  CALCIUM 9.5  GFRNONAA >60  GFRAA >60  PROT 7.2  ALBUMIN 4.3  AST 34  ALT 30  ALKPHOS 195*  BILITOT 0.7     RADIOGRAPHIC STUDIES: I have personally reviewed the radiological images as listed and agreed with the findings in the report. PET scan 01/20/2018 1. Hypermetabolic mass in the distal esophagus and adjacent stomach, SUV 13.8, compatible with malignancy. There are over 10 scattered hypermetabolic masses in the liver compatible with widespread hepatic metastatic disease. 2. 8 mm notably cavitary right lower lobe pulmonary nodule is faintly hypermetabolic. Although the maximum SUV is only 1.1, the wall thickness of this lesion is only about 2 mm and accordingly the activity is felt to be abnormal relative to the size of the lesion. This could represent a solitary metastatic lesion to the lung, or a cavitary infectious/inflammatory process. 3. No definite hypermetabolic adenopathy or other metastatic lesions observed. Please note that negative predictive value is adversely affected by the  presence of very dense residual barium in the colon (implying delayed clearance of barium from the bowel, given that this barium was administered 5 days ago) and due to the patient's marked paucity of intra-adipose tissue which complicates separation of adjacent structures. In order to help offset the dense barium, I did carefully reviewed the non-attenuation corrected lesions. 4. Other imaging findings of potential clinical significance: Aortic Atherosclerosis (ICD10-I70.0). Coronary atherosclerosis. Thoracic kyphosis.  ASSESSMENT & PLAN:  1. Esophageal mass   2. Severe protein-calorie malnutrition ([place])    # PET scan results of the biopsy report were discussed with patient.  EGD biopsy reports only showed high-grade dysplasia, patient clinical picture highly suspect metastatic esophageal cancer.  Proceed with ultrasound-guided liver biopsy.  Once his biopsy results came back we will discuss in details about treatment plan.  He most likely will need stomach therapy treatment. #Agree with referring to radiation oncologist for evaluation of palliative radiation. #Esophageal obstruction/severe malnutrition/weight loss: Discussed with patient that palliative radiation and systemic chemotherapy may take time to shrink the esophageal mass and relieve obstruction and his nutritional status need to be supported.  Recommend a PEG tube placement.  Will refer to interventional radiology for evaluation of PEG tube placement.  #For his personality changes/? dementia: Obtain MRI of brain with and without contrast to further evaluate any CNS involvement. #Pain: Patient reports generalized pain and pain with swallowing.  Liquid morphine can be an option however the problem if he can swallow or not.  Will start patient with fentanyl patch 25 mcg/h every 72 hours.  Prescription was sent to patient's preferred pharmacy  #Obtain CBC and CMP today.  Schedule patient to meet dietitian for further evaluation.  I will  schedule patient for daily IV fluid hydration All questions were answered. The patient knows to call the clinic with any problems questions or concerns.  Return of visit: 01/29/2018 to discuss about pathological results. Thank you for this kind referral and the opportunity to participate in the care of this patient. A copy of today's note is routed to referring provider    Earlie Server, MD, PhD Hematology Oncology [facility] at [facility] Pager- [phone] 01/25/2018

## 2018-01-26 ENCOUNTER — Ambulatory Visit
Admission: RE | Admit: 2018-01-26 | Discharge: 2018-01-26 | Disposition: A | Discharge status: Home / Self Care | Payer: Medicare HMO | Source: Ambulatory Visit | Attending: Oncology | Admitting: Oncology

## 2018-01-26 ENCOUNTER — Inpatient Hospital Stay: Payer: Medicare HMO

## 2018-01-26 VITALS — BP 161/79 | HR 82 | Temp 96.2°F | Resp 16

## 2018-01-26 DIAGNOSIS — K228 Other specified diseases of esophagus: Secondary | ICD-10-CM

## 2018-01-26 DIAGNOSIS — Z79899 Other long term (current) drug therapy: Secondary | ICD-10-CM | POA: Insufficient documentation

## 2018-01-26 DIAGNOSIS — E785 Hyperlipidemia, unspecified: Secondary | ICD-10-CM | POA: Diagnosis not present

## 2018-01-26 DIAGNOSIS — F17210 Nicotine dependence, cigarettes, uncomplicated: Secondary | ICD-10-CM | POA: Diagnosis not present

## 2018-01-26 DIAGNOSIS — C159 Malignant neoplasm of esophagus, unspecified: Secondary | ICD-10-CM | POA: Insufficient documentation

## 2018-01-26 DIAGNOSIS — C787 Secondary malignant neoplasm of liver and intrahepatic bile duct: Secondary | ICD-10-CM | POA: Diagnosis not present

## 2018-01-26 DIAGNOSIS — R160 Hepatomegaly, not elsewhere classified: Secondary | ICD-10-CM | POA: Diagnosis present

## 2018-01-26 DIAGNOSIS — Z7982 Long term (current) use of aspirin: Secondary | ICD-10-CM | POA: Insufficient documentation

## 2018-01-26 DIAGNOSIS — I10 Essential (primary) hypertension: Secondary | ICD-10-CM | POA: Insufficient documentation

## 2018-01-26 DIAGNOSIS — Z5111 Encounter for antineoplastic chemotherapy: Secondary | ICD-10-CM | POA: Diagnosis not present

## 2018-01-26 DIAGNOSIS — K2289 Other specified disease of esophagus: Secondary | ICD-10-CM

## 2018-01-26 DIAGNOSIS — Z85828 Personal history of other malignant neoplasm of skin: Secondary | ICD-10-CM | POA: Diagnosis not present

## 2018-01-26 LAB — CEA: CEA: 3857 ng/mL — ABNORMAL HIGH (ref 0.0–4.7)

## 2018-01-26 MED ORDER — FENTANYL CITRATE (PF) 100 MCG/2ML IJ SOLN
INTRAMUSCULAR | Status: AC | PRN
  Administered 2018-01-26: 50 ug via INTRAVENOUS
  Administered 2018-01-26: 25 ug via INTRAVENOUS

## 2018-01-26 MED ORDER — SODIUM CHLORIDE 0.9 % IV SOLN
INTRAVENOUS | Status: DC
  Administered 2018-01-26: 11:00:00 via INTRAVENOUS

## 2018-01-26 MED ORDER — MIDAZOLAM HCL 2 MG/2ML IJ SOLN
INTRAMUSCULAR | Status: AC
  Filled 2018-01-26: qty 2

## 2018-01-26 MED ORDER — SODIUM CHLORIDE 0.9 % IV SOLN
Freq: Once | INTRAVENOUS | Status: AC
  Administered 2018-01-26: 15:00:00 via INTRAVENOUS
  Filled 2018-01-26: qty 1000

## 2018-01-26 MED ORDER — MIDAZOLAM HCL 2 MG/2ML IJ SOLN
INTRAMUSCULAR | Status: AC | PRN
  Administered 2018-01-26 (×2): 1 mg via INTRAVENOUS

## 2018-01-26 MED ORDER — FENTANYL CITRATE (PF) 100 MCG/2ML IJ SOLN
INTRAMUSCULAR | Status: AC
  Filled 2018-01-26: qty 2

## 2018-01-26 NOTE — OR Nursing (Signed)
Dr Kathlene Cote assessed pt, oked early discharge since going to cancer center for transfusion.

## 2018-01-26 NOTE — Procedures (Signed)
Interventional Radiology Procedure Note  Procedure: US guided liver biopsy  Complications: None  Estimated Blood Loss: < 10 mL  Findings: 18 G core biopsy x 3 via 17 G needle of 6 cm right inferior liver mass.  Venetia Night. Kathlene Cote, M.D Pager:  [phone]

## 2018-01-26 NOTE — Progress Notes (Signed)
Patient here today for liver biopsy, disoriented, spoke with Dr Kathlene Cote and PA, not able to sign consent.thus significant other here to sign consent with questions answered.

## 2018-01-26 NOTE — Discharge Instructions (Signed)
Liver Biopsy, Care After °These instructions give you information on caring for yourself after your procedure. Your doctor may also give you more specific instructions. Call your doctor if you have any problems or questions after your procedure. °Follow these instructions at home: °· Rest at home for 1-2 days or as told by your doctor. °· Have someone stay with you for at least 24 hours. °· Do not do these things in the first 24 hours: °? Drive. °? Use machinery. °? Take care of other people. °? Sign legal documents. °? Take a bath or shower. °· There are many different ways to close and cover a cut (incision). For example, a cut can be closed with stitches, skin glue, or adhesive strips. Follow your doctor's instructions on: °? Taking care of your cut. °? Changing and removing your bandage (dressing). °? Removing whatever was used to close your cut. °· Do not drink alcohol in the first week. °· Do not lift more than 5 pounds or play contact sports for the first 2 weeks. °· Take medicines only as told by your doctor. For 1 week, do not take medicine that has aspirin in it or medicines like ibuprofen. °· Get your test results. °Contact a doctor if: °· A cut bleeds and leaves more than just a small spot of blood. °· A cut is red, puffs up (swells), or hurts more than before. °· Fluid or something else comes from a cut. °· A cut smells bad. °· You have a fever or chills. °Get help right away if: °· You have swelling, bloating, or pain in your belly (abdomen). °· You get dizzy or faint. °· You have a rash. °· You feel sick to your stomach (nauseous) or throw up (vomit). °· You have trouble breathing, feel short of breath, or feel faint. °· Your chest hurts. °· You have problems talking or seeing. °· You have trouble balancing or moving your arms or legs. °This information is not intended to replace advice given to you by your health care provider. Make sure you discuss any questions you have with your health care  provider. °Document Released: 07/22/2008 Document Revised: 03/20/2016 Document Reviewed: 12/09/2013 °Elsevier Interactive Patient Education © 2018 [employer] ° °

## 2018-01-26 NOTE — Consult Note (Signed)
Chief Complaint: Patient was seen in consultation today for image guided liver lesion biopsy  Referring Physician(s): Yu,Zhou  Supervising Physician: Aletta Edouard  Patient Status: [facility] - Out-pt  History of Present Illness: Evan Mason is a 72 y.o. male history of weight loss, dysphagia, and gastroesophageal junction mass with recent EGD and biopsy on 01/19/18 which revealed high-grade dysplasia, adenocarcinoma not excluded.  Recent PET scan  also revealed multiple hypermetabolic liver lesions, and cavitary 8 mm right lower lobe pulmonary nodule.  Patient presents today for image guided liver lesion biopsy for further evaluation.  Past Medical History:  Diagnosis Date  . Basal cell carcinoma   . Esophageal mass 01/20/2018   From PET scan order  . Hyperlipidemia   . Hypertension     Past Surgical History:  Procedure Laterality Date  . COLONOSCOPY WITH PROPOFOL N/A 12/27/2015   Procedure: COLONOSCOPY WITH PROPOFOL;  Surgeon: Manya Silvas, MD;  Location: [facility] ENDOSCOPY;  Service: Endoscopy;  Laterality: N/A;  . ESOPHAGOGASTRODUODENOSCOPY (EGD) WITH PROPOFOL N/A 01/19/2018   Procedure: ESOPHAGOGASTRODUODENOSCOPY (EGD) WITH PROPOFOL;  Surgeon: Lollie Sails, MD;  Location: [facility] ENDOSCOPY;  Service: Endoscopy;  Laterality: N/A;  . MOHS SURGERY      Allergies: Patient has no known allergies.  Medications: Prior to Admission medications   Medication Sig Start Date End Date Taking? Authorizing Provider  amLODipine (NORVASC) 10 MG tablet Take 10 mg by mouth daily.   Yes [provider]  aspirin 81 MG tablet Take 81 mg by mouth daily.   Yes [provider]  dexlansoprazole (DEXILANT) 60 MG capsule Take 60 mg by mouth daily.   Yes [provider]  hydrochlorothiazide (HYDRODIURIL) 25 MG tablet Take 25 mg by mouth daily.   Yes [provider]  lisinopril (PRINIVIL,ZESTRIL) 40 MG tablet Take 40 mg by mouth daily.   Yes [provider]  Multiple Vitamins-Minerals (MULTIVITAMIN WITH MINERALS) tablet Take 1 tablet by mouth daily.   Yes [provider]  OMEGA-3 FATTY ACIDS PO Take by mouth.   Yes [provider]  sildenafil (REVATIO) 20 MG tablet Take 20 mg by mouth 3 (three) times daily.   Yes [provider]  fentaNYL (DURAGESIC - DOSED MCG/HR) 25 MCG/HR patch Place 1 patch (25 mcg total) onto the skin every 3 (three) days. Patient not taking: Reported on 01/26/2018 01/25/18   Earlie Server, MD     No family history on file.  Social History   Socioeconomic History  . Marital status: Divorced    Spouse name: Not on file  . Number of children: Not on file  . Years of education: Not on file  . Highest education level: Not on file  Occupational History  . Not on file  Social Needs  . Financial resource strain: Not on file  . Food insecurity:    Worry: Not on file    Inability: Not on file  . Transportation needs:    Medical: Not on file    Non-medical: Not on file  Tobacco Use  . Smoking status: Current Every Day Smoker    Packs/day: 0.25    Types: Cigarettes  . Smokeless tobacco: Never Used  Substance and Sexual Activity  . Alcohol use: No  . Drug use: No  . Sexual activity: Not on file  Lifestyle  . Physical activity:    Days per week: Not on file    Minutes per session: Not on file  . Stress: Not on file  Relationships  .  Social connections:    Talks on phone: Not on file    Gets together: Not on file    Attends religious service: Not on file    Active member of club or organization: Not on file    Attends meetings of clubs or organizations: Not on file    Relationship status: Not on file  Other Topics Concern  . Not on file  Social History Narrative  . Not on file      Review of Systems currently denies fever, headache, chest pain, worsening dyspnea, nausea, vomiting or bleeding.  Does have confusion.  Vital Signs: BP 140/79   Pulse 92   Temp 97.9 F  (36.6 C) (Oral)   Resp 20   Ht 5\' 8"  (1.727 m)   Wt 121 lb (54.9 kg)   SpO2 98%   BMI 18.40 kg/m   Physical Exam patient awake, oriented to person, birthday and location but not year or day of week.  Chest clear to auscultation bilaterally.  Heart with normal rate, ectopy noted.  Abdomen soft, positive bowel sounds, nontender.  No lower extremity edema.  Imaging: Dg Esophagus  Result Date: 01/15/2018 CLINICAL DATA:  Trouble swallowing for 3-4 months. EXAM: ESOPHOGRAM / BARIUM SWALLOW TECHNIQUE: Combined double contrast and single contrast examination performed using effervescent crystals, thick barium liquid, and thin barium liquid. FLUOROSCOPY TIME:  Fluoroscopy Time:  0.9 minute Radiation Exposure Index (if provided by the fluoroscopic device): 3.7 mGy Number of Acquired Spot Images: 0 COMPARISON:  None. FINDINGS: There was normal pharyngeal anatomy and motility. Contrast flowed freely through the proximal and mid esophagus without evidence of stricture or mass. Distal esophagus just proximal to the gastroesophageal junction demonstrates an irregular stricture measuring 2.5 cm in length. Esophageal motility was normal. No evidence of reflux. No definite hiatal hernia was demonstrated. IMPRESSION: 1. 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 Recommend further evaluation with upper endoscopy. Electronically Signed   By: Kathreen Devoid   On: 01/15/2018 08:34   Nm Pet Image Initial (pi) Skull Base To Thigh  Result Date: 01/20/2018 CLINICAL DATA:  Initial treatment strategy for distal esophageal mass. EXAM: NUCLEAR MEDICINE PET SKULL BASE TO THIGH TECHNIQUE: 6.8 mCi F-18 FDG was injected intravenously. Full-ring PET imaging was performed from the skull base to thigh after the radiotracer. CT data was obtained and used for attenuation correction and anatomic localization. Fasting  blood glucose: 88 mg/dl COMPARISON:  Esophagram dated 01/15/2018 FINDINGS: Mediastinal blood pool activity: SUV max 1.9 NECK: No significant abnormal hypermetabolic activity in the neck. Incidental CT findings: none CHEST: In the distal esophagus and extending into the adjacent gastric cardia, a hypermetabolic mass is present with maximum SUV 13.8, corresponding to the area of irregularity on recent esophagram. No appreciable hypermetabolic intrathoracic adenopathy. 0.7 by 0.8 cm right lower lobe pulmonary nodule on image 99/3 demonstrates central cavitation and has visible but very low-grade activity with maximum SUV of 1.1. A nearby empty portion of the right lower lobe has a maximum SUV of 0.4. Incidental CT findings: Coronary, aortic arch, and branch vessel atherosclerotic vascular disease. ABDOMEN/PELVIS: Multiple hypermetabolic masses are present in the liver. An index lesion in the posterior inferior right hepatic lobe has a maximum standard uptake value of 32.4 and has metabolic activity measuring 4.9 by 4.6 cm. At least 10 other masses are present scattered in the liver. I do not see definite abnormal hypermetabolic  activity within the porta hepatis. The patient has marked paucity of intra-adipose tissue as well as the extensive retained very high density barium reduced diagnostic sensitivity and specificity by causing severe streak artifact and high density that disrupts the attenuation correction of the PET-CT. I did deliberately evaluate non attenuation corrected images as well in order to help contract the effect of the dense barium and attenuation correction. No additional hypermetabolic intra-abdominal lesions are observed. Incidental CT findings: Images are severely degraded by the streak artifact from the dense barium. Delayed transit time is implied as the patient's barium administration was 5 days ago, and most of this barium should have cleared by now. Aortoiliac atherosclerotic vascular disease.  Vascular calcifications in both renal hila. SKELETON: No findings of hypermetabolic skeletal lesion. Focus of activity in the left antecubital fossa is felt to be injection related and matches the site of injection. Incidental CT findings: Thoracic kyphosis. IMPRESSION: 1. Hypermetabolic mass in the distal esophagus and adjacent stomach, SUV 13.8, compatible with malignancy. There are over 10 scattered hypermetabolic masses in the liver compatible with widespread hepatic metastatic disease. 2. 8 mm notably cavitary right lower lobe pulmonary nodule is faintly hypermetabolic. Although the maximum SUV is only 1.1, the wall thickness of this lesion is only about 2 mm and accordingly the activity is felt to be abnormal relative to the size of the lesion. This could represent a solitary metastatic lesion to the lung, or a cavitary infectious/inflammatory process. 3. No definite hypermetabolic adenopathy or other metastatic lesions observed. Please note that negative predictive value is adversely affected by the presence of very dense residual barium in the colon (implying delayed clearance of barium from the bowel, given that this barium was administered 5 days ago) and due to the patient's marked paucity of intra-adipose tissue which complicates separation of adjacent structures. In order to help offset the dense barium, I did carefully reviewed the non-attenuation corrected lesions. 4. Other imaging findings of potential clinical significance: Aortic Atherosclerosis (ICD10-I70.0). Coronary atherosclerosis. Thoracic kyphosis. Electronically Signed   By: Van Clines M.D.   On: 01/20/2018 14:18    Labs:  CBC: Recent Labs    01/25/18 1248  WBC 7.9  HGB 14.2  HCT 41.2  PLT 348    COAGS: Recent Labs    01/25/18 1248  INR 0.96  APTT 30    BMP: Recent Labs    01/25/18 1248  NA 136  K 4.3  CL 101  CO2 25  GLUCOSE 75  BUN 35*  CALCIUM 9.5  CREATININE 0.94  GFRNONAA >60  GFRAA >60     LIVER FUNCTION TESTS: Recent Labs    01/25/18 1248  BILITOT 0.7  AST 34  ALT 30  ALKPHOS 195*  PROT 7.2  ALBUMIN 4.3    TUMOR MARKERS: No results for input(s): AFPTM, CEA, CA199, CHROMGRNA in the last 8760 hours.  Assessment and Plan: 72 y.o. male history of weight loss, dysphagia, and gastroesophageal junction mass with recent EGD and biopsy on 01/19/18 which revealed high-grade dysplasia, adenocarcinoma not excluded.  Recent PET scan  also revealed multiple hypermetabolic liver lesions, and cavitary 8 mm right lower lobe pulmonary nodule.  Patient presents today for image guided liver lesion biopsy for further evaluation.Risks and benefits discussed with the patient/significant other, Rudi Coco, including, but not limited to bleeding, infection, damage to adjacent structures or low yield requiring additional tests.  All of the patient's questions were answered, patient/sig other are agreeable to proceed. Consent signed and in chart.  Labs pending   Thank you for this interesting consult.  I greatly enjoyed meeting VERA WISHART and look forward to participating in their care.  A copy of this report was sent to the requesting provider on this date.  Electronically Signed: D. Rowe Robert, PA-C 01/26/2018, 11:15 AM   I spent a total of 25 minutes    in face to face in clinical consultation, greater than 50% of which was counseling/coordinating care for image guided liver lesion biopsy

## 2018-01-27 ENCOUNTER — Inpatient Hospital Stay: Payer: Medicare HMO

## 2018-01-27 ENCOUNTER — Ambulatory Visit
Admission: RE | Admit: 2018-01-27 | Discharge: 2018-01-27 | Disposition: A | Discharge status: Home / Self Care | Payer: Medicare HMO | Source: Ambulatory Visit | Attending: Radiation Oncology | Admitting: Radiation Oncology

## 2018-01-27 ENCOUNTER — Other Ambulatory Visit: Payer: Self-pay | Admitting: Radiology

## 2018-01-27 VITALS — BP 131/77 | HR 92 | Temp 97.4°F | Resp 20

## 2018-01-27 DIAGNOSIS — C155 Malignant neoplasm of lower third of esophagus: Secondary | ICD-10-CM | POA: Diagnosis not present

## 2018-01-27 DIAGNOSIS — Z5111 Encounter for antineoplastic chemotherapy: Secondary | ICD-10-CM | POA: Diagnosis not present

## 2018-01-27 DIAGNOSIS — K2289 Other specified disease of esophagus: Secondary | ICD-10-CM

## 2018-01-27 DIAGNOSIS — Z510 Encounter for antineoplastic radiation therapy: Secondary | ICD-10-CM | POA: Insufficient documentation

## 2018-01-27 DIAGNOSIS — K228 Other specified diseases of esophagus: Secondary | ICD-10-CM

## 2018-01-27 MED ORDER — SODIUM CHLORIDE 0.9 % IV SOLN
Freq: Once | INTRAVENOUS | Status: AC
  Administered 2018-01-27: 11:00:00 via INTRAVENOUS
  Filled 2018-01-27: qty 1000

## 2018-01-28 ENCOUNTER — Inpatient Hospital Stay: Payer: Medicare HMO

## 2018-01-28 ENCOUNTER — Telehealth (INDEPENDENT_AMBULATORY_CARE_PROVIDER_SITE_OTHER): Payer: Self-pay

## 2018-01-28 ENCOUNTER — Encounter (INDEPENDENT_AMBULATORY_CARE_PROVIDER_SITE_OTHER): Payer: Self-pay

## 2018-01-28 ENCOUNTER — Other Ambulatory Visit: Payer: Self-pay | Admitting: Oncology

## 2018-01-28 ENCOUNTER — Other Ambulatory Visit: Payer: Self-pay | Admitting: Radiology

## 2018-01-28 VITALS — BP 126/77 | HR 118 | Temp 98.1°F | Resp 20

## 2018-01-28 DIAGNOSIS — K228 Other specified diseases of esophagus: Secondary | ICD-10-CM

## 2018-01-28 DIAGNOSIS — Z5111 Encounter for antineoplastic chemotherapy: Secondary | ICD-10-CM | POA: Diagnosis not present

## 2018-01-28 DIAGNOSIS — K2289 Other specified disease of esophagus: Secondary | ICD-10-CM

## 2018-01-28 LAB — SURGICAL PATHOLOGY

## 2018-01-28 MED ORDER — SODIUM CHLORIDE 0.9 % IV SOLN
Freq: Once | INTRAVENOUS | Status: AC
  Administered 2018-01-28: 15:00:00 via INTRAVENOUS
  Filled 2018-01-28: qty 1000

## 2018-01-28 NOTE — Patient Instructions (Signed)

## 2018-01-28 NOTE — Telephone Encounter (Signed)
Evan Mason called back and the patient is scheduled for 02/02/18 with [employer].

## 2018-01-28 NOTE — Telephone Encounter (Signed)
Called to schedule the patient for a port placement, patient was confused and kept stating he didn't know and I should speak with Fraser Din ( she is on his release ).  I attempted to contact Fraser Din and had to leave a message.

## 2018-01-29 ENCOUNTER — Inpatient Hospital Stay: Payer: Medicare HMO

## 2018-01-29 ENCOUNTER — Other Ambulatory Visit: Payer: Self-pay

## 2018-01-29 ENCOUNTER — Telehealth: Payer: Self-pay

## 2018-01-29 ENCOUNTER — Ambulatory Visit
Admission: RE | Admit: 2018-01-29 | Discharge: 2018-01-29 | Disposition: A | Discharge status: Home / Self Care | Payer: Medicare HMO | Source: Ambulatory Visit | Attending: Oncology | Admitting: Oncology

## 2018-01-29 ENCOUNTER — Other Ambulatory Visit: Payer: Self-pay | Admitting: Oncology

## 2018-01-29 ENCOUNTER — Inpatient Hospital Stay (HOSPITAL_BASED_OUTPATIENT_CLINIC_OR_DEPARTMENT_OTHER): Payer: Medicare HMO | Admitting: Oncology

## 2018-01-29 VITALS — BP 130/75 | HR 81 | Temp 95.0°F | Resp 20 | Wt 122.4 lb

## 2018-01-29 DIAGNOSIS — Z85828 Personal history of other malignant neoplasm of skin: Secondary | ICD-10-CM | POA: Diagnosis not present

## 2018-01-29 DIAGNOSIS — C159 Malignant neoplasm of esophagus, unspecified: Secondary | ICD-10-CM | POA: Diagnosis not present

## 2018-01-29 DIAGNOSIS — C155 Malignant neoplasm of lower third of esophagus: Secondary | ICD-10-CM

## 2018-01-29 DIAGNOSIS — Z79899 Other long term (current) drug therapy: Secondary | ICD-10-CM | POA: Diagnosis not present

## 2018-01-29 DIAGNOSIS — R410 Disorientation, unspecified: Secondary | ICD-10-CM | POA: Diagnosis not present

## 2018-01-29 DIAGNOSIS — R5383 Other fatigue: Secondary | ICD-10-CM | POA: Diagnosis not present

## 2018-01-29 DIAGNOSIS — Z7189 Other specified counseling: Secondary | ICD-10-CM

## 2018-01-29 DIAGNOSIS — F17210 Nicotine dependence, cigarettes, uncomplicated: Secondary | ICD-10-CM | POA: Insufficient documentation

## 2018-01-29 DIAGNOSIS — I700 Atherosclerosis of aorta: Secondary | ICD-10-CM

## 2018-01-29 DIAGNOSIS — C787 Secondary malignant neoplasm of liver and intrahepatic bile duct: Secondary | ICD-10-CM | POA: Diagnosis not present

## 2018-01-29 DIAGNOSIS — Z5111 Encounter for antineoplastic chemotherapy: Secondary | ICD-10-CM | POA: Diagnosis not present

## 2018-01-29 DIAGNOSIS — K228 Other specified diseases of esophagus: Secondary | ICD-10-CM

## 2018-01-29 DIAGNOSIS — K2289 Other specified disease of esophagus: Secondary | ICD-10-CM

## 2018-01-29 DIAGNOSIS — M795 Residual foreign body in soft tissue: Secondary | ICD-10-CM

## 2018-01-29 DIAGNOSIS — R5381 Other malaise: Secondary | ICD-10-CM

## 2018-01-29 DIAGNOSIS — Z7982 Long term (current) use of aspirin: Secondary | ICD-10-CM | POA: Diagnosis not present

## 2018-01-29 DIAGNOSIS — R634 Abnormal weight loss: Secondary | ICD-10-CM | POA: Diagnosis not present

## 2018-01-29 DIAGNOSIS — R52 Pain, unspecified: Secondary | ICD-10-CM

## 2018-01-29 DIAGNOSIS — R1310 Dysphagia, unspecified: Secondary | ICD-10-CM

## 2018-01-29 DIAGNOSIS — E43 Unspecified severe protein-calorie malnutrition: Secondary | ICD-10-CM

## 2018-01-29 DIAGNOSIS — I2510 Atherosclerotic heart disease of native coronary artery without angina pectoris: Secondary | ICD-10-CM | POA: Diagnosis not present

## 2018-01-29 DIAGNOSIS — I10 Essential (primary) hypertension: Secondary | ICD-10-CM | POA: Insufficient documentation

## 2018-01-29 DIAGNOSIS — E785 Hyperlipidemia, unspecified: Secondary | ICD-10-CM | POA: Diagnosis not present

## 2018-01-29 HISTORY — PX: IR GASTROSTOMY TUBE MOD SED: IMG625

## 2018-01-29 LAB — COMPREHENSIVE METABOLIC PANEL
ALT: 31 U/L (ref 17–63)
AST: 37 U/L (ref 15–41)
Albumin: 3.6 g/dL (ref 3.5–5.0)
Alkaline Phosphatase: 181 U/L — ABNORMAL HIGH (ref 38–126)
Anion gap: 9 (ref 5–15)
BUN: 27 mg/dL — ABNORMAL HIGH (ref 6–20)
CALCIUM: 9 mg/dL (ref 8.9–10.3)
CHLORIDE: 102 mmol/L (ref 101–111)
CO2: 25 mmol/L (ref 22–32)
Creatinine, Ser: 0.79 mg/dL (ref 0.61–1.24)
GFR calc Af Amer: >60 mL/min (ref >60)
GFR calc non Af Amer: >60 mL/min (ref >60)
Glucose, Bld: 98 mg/dL (ref 65–99)
Potassium: 4.2 mmol/L (ref 3.5–5.1)
SODIUM: 136 mmol/L (ref 135–145)
TOTAL PROTEIN: 6.1 g/dL — AB (ref 6.5–8.1)
Total Bilirubin: 0.5 mg/dL (ref 0.3–1.2)

## 2018-01-29 LAB — PROTIME-INR
INR: 0.94
PROTHROMBIN TIME: 12.5 s (ref 11.4–15.2)

## 2018-01-29 LAB — CBC WITH DIFFERENTIAL/PLATELET
BASOS ABS: 0.1 10*3/uL (ref 0–0.1)
BASOS PCT: 1 %
Eosinophils Absolute: 0.1 10*3/uL (ref 0–0.7)
Eosinophils Relative: 2 %
HEMATOCRIT: 39 % — AB (ref 40.0–52.0)
HEMOGLOBIN: 13.3 g/dL (ref 13.0–18.0)
Lymphocytes Relative: 14 %
Lymphs Abs: 1 10*3/uL (ref 1.0–3.6)
MCH: 31 pg (ref 26.0–34.0)
MCHC: 34.2 g/dL (ref 32.0–36.0)
MCV: 90.7 fL (ref 80.0–100.0)
Monocytes Absolute: 0.6 10*3/uL (ref 0.2–1.0)
Monocytes Relative: 7 %
NEUTROS PCT: 76 %
Neutro Abs: 5.8 10*3/uL (ref 1.4–6.5)
Platelets: 302 10*3/uL (ref 150–440)
RBC: 4.3 MIL/uL — AB (ref 4.40–5.90)
RDW: 14.4 % (ref 11.5–14.5)
WBC: 7.6 10*3/uL (ref 3.8–10.6)

## 2018-01-29 LAB — AMMONIA: Ammonia: <9 umol/L — ABNORMAL LOW (ref 9–35)

## 2018-01-29 MED ORDER — SODIUM CHLORIDE 0.9 % IV SOLN
INTRAVENOUS | Status: DC
  Administered 2018-01-29: 1000 mL via INTRAVENOUS

## 2018-01-29 MED ORDER — LIDOCAINE HCL (PF) 1 % IJ SOLN
INTRAMUSCULAR | Status: AC
  Filled 2018-01-29: qty 30

## 2018-01-29 MED ORDER — MIDAZOLAM HCL 5 MG/5ML IJ SOLN
INTRAMUSCULAR | Status: AC | PRN
  Administered 2018-01-29: 1 mg via INTRAVENOUS

## 2018-01-29 MED ORDER — FENTANYL CITRATE (PF) 100 MCG/2ML IJ SOLN
INTRAMUSCULAR | Status: AC
  Filled 2018-01-29: qty 2

## 2018-01-29 MED ORDER — GLUCAGON HCL RDNA (DIAGNOSTIC) 1 MG IJ SOLR
INTRAMUSCULAR | Status: AC
  Filled 2018-01-29: qty 1

## 2018-01-29 MED ORDER — GADOBENATE DIMEGLUMINE 529 MG/ML IV SOLN
10.0000 mL | Freq: Once | INTRAVENOUS | Status: AC | PRN
  Administered 2018-01-29: 10 mL via INTRAVENOUS

## 2018-01-29 MED ORDER — STERILE WATER FOR INJECTION IJ SOLN
INTRAMUSCULAR | Status: AC
  Filled 2018-01-29: qty 10

## 2018-01-29 MED ORDER — IOPAMIDOL (ISOVUE-300) INJECTION 61%: 15.0000 mL | Freq: Once | INTRAVENOUS | Status: DC | PRN

## 2018-01-29 MED ORDER — FENTANYL CITRATE (PF) 100 MCG/2ML IJ SOLN
INTRAMUSCULAR | Status: AC | PRN
  Administered 2018-01-29: 50 ug via INTRAVENOUS

## 2018-01-29 MED ORDER — MIDAZOLAM HCL 5 MG/5ML IJ SOLN
INTRAMUSCULAR | Status: AC
  Filled 2018-01-29: qty 5

## 2018-01-29 MED ORDER — GLUCAGON HCL (RDNA) 1 MG IJ SOLR
INTRAMUSCULAR | Status: AC | PRN
  Administered 2018-01-29: .5 mg via INTRAVENOUS

## 2018-01-29 MED ORDER — CEFAZOLIN SODIUM-DEXTROSE 2-4 GM/100ML-% IV SOLN
2.0000 g | INTRAVENOUS | Status: AC
  Administered 2018-01-29: 2 g via INTRAVENOUS

## 2018-01-29 NOTE — Procedures (Signed)
Interventional Radiology Procedure Note  Procedure: Gastrostomy tube insertion   Complications: None  Estimated Blood Loss: < 10 mL  Findings: 16 Fr balloon retention Intuit gastrostomy tube placed via direct gastric puncture.  Tip in body of stomach.  Venetia Night. Kathlene Cote, M.D Pager:  [phone]

## 2018-01-29 NOTE — Telephone Encounter (Signed)
Received call from daughter Burt. They would like further information regarding skilled placement when that time comes. Referral sent to [facility], PSN. Oncology Nurse Navigator Documentation  Navigator Location: [facility] (01/29/18 1500)   )Navigator Encounter Type: Telephone (01/29/18 1500) Telephone: Incoming Call (01/29/18 1500)                                                  Time Spent with Patient: 15 (01/29/18 1500)

## 2018-01-29 NOTE — H&P (Signed)
Chief Complaint: Patient was seen in consultation today for gastrostomy tube placement at the request of Yu,Zhou  Referring Physician(s): Yu,Zhou  Patient Status: Evan Mason - Out-pt  History of Present Illness: Evan Mason is a 72 y.o. 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  Now here for gastrostomy tube placement, as he is unable to eat solid food and has lost weight.  Additional nutrition needed prior to planned chemotherapy.  Past Medical History:  Diagnosis Date  . Basal cell carcinoma   . Esophageal mass 01/20/2018   From PET scan order  . Hyperlipidemia   . Hypertension     Past Surgical History:  Procedure Laterality Date  . COLONOSCOPY WITH PROPOFOL N/A 12/27/2015   Procedure: COLONOSCOPY WITH PROPOFOL;  Surgeon: Manya Silvas, MD;  Location: [facility] ENDOSCOPY;  Service: Endoscopy;  Laterality: N/A;  . ESOPHAGOGASTRODUODENOSCOPY (EGD) WITH PROPOFOL N/A 01/19/2018   Procedure: ESOPHAGOGASTRODUODENOSCOPY (EGD) WITH PROPOFOL;  Surgeon: Lollie Sails, MD;  Location: [facility] ENDOSCOPY;  Service: Endoscopy;  Laterality: N/A;  . MOHS SURGERY      Allergies: Patient has no known allergies.  Medications: Prior to Admission medications   Medication Sig Start Date End Date Taking? Authorizing Provider  amLODipine (NORVASC) 10 MG tablet Take 10 mg by mouth daily.    [provider]  aspirin EC 81 MG tablet Take 81 mg by mouth daily.    [provider]  dexlansoprazole (DEXILANT) 60 MG capsule Take 60 mg by mouth daily.    [provider]  fentaNYL (DURAGESIC - DOSED MCG/HR) 25 MCG/HR patch Place 1 patch (25 mcg total) onto the skin every 3 (three) days. 01/25/18   Earlie Server, MD  hydrochlorothiazide (HYDRODIURIL) 25 MG tablet Take 25 mg by mouth daily.    [provider]  lisinopril  (PRINIVIL,ZESTRIL) 40 MG tablet Take 40 mg by mouth daily.    [provider]  Multiple Vitamin (MULTIVITAMIN WITH MINERALS) TABS tablet Take 1 tablet by mouth daily.    [provider]  Omega-3 Fatty Acids (FISH OIL PO) Take 1 capsule by mouth daily.    [provider]  sildenafil (REVATIO) 20 MG tablet Take 40-100 mg by mouth daily as needed (erectile dysfunction).     [provider]     No family history on file.  Social History   Socioeconomic History  . Marital status: Divorced    Spouse name: Not on file  . Number of children: Not on file  . Years of education: Not on file  . Highest education level: Not on file  Occupational History  . Not on file  Social Needs  . Financial resource strain: Not on file  . Food insecurity:    Worry: Not on file    Inability: Not on file  . Transportation needs:    Medical: Not on file    Non-medical: Not on file  Tobacco Use  . Smoking status: Current Every Day Smoker    Packs/day: 0.25    Types: Cigarettes  . Smokeless tobacco: Never Used  Substance and Sexual Activity  . Alcohol use: No  . Drug use: No  . Sexual activity: Not on file  Lifestyle  . Physical activity:    Days per week: Not on file    Minutes per session: Not on file  . Stress: Not on  file  Relationships  . Social connections:    Talks on phone: Not on file    Gets together: Not on file    Attends religious service: Not on file    Active member of club or organization: Not on file    Attends meetings of clubs or organizations: Not on file    Relationship status: Not on file  Other Topics Concern  . Not on file  Social History Narrative  . Not on file    ECOG Status: 1 - Symptomatic but completely ambulatory  Review of Systems: A 12 point ROS discussed and pertinent positives are indicated in the HPI above.  All other systems are negative.  Review of Systems  Constitutional: Positive for activity change and  unexpected weight change.  HENT: Positive for trouble swallowing.   Respiratory: Negative.   Cardiovascular: Negative.   Gastrointestinal: Negative.   Genitourinary: Negative.   Musculoskeletal: Negative.   Neurological:       Some confusion, altered mental status.    Vital Signs: There were no vitals taken for this visit.  Physical Exam  Constitutional: No distress.  HENT:  Head: Normocephalic and atraumatic.  Neck: Neck supple. No JVD present.  Cardiovascular: Normal rate, regular rhythm and normal heart sounds. Exam reveals no gallop and no friction rub.  No murmur heard. Pulmonary/Chest: Effort normal and breath sounds normal. No stridor. No respiratory distress. He has no wheezes. He has no rales.  Abdominal: Soft. Bowel sounds are normal. He exhibits no distension. There is no tenderness. There is no rebound and no guarding.  Musculoskeletal: He exhibits no edema.  Lymphadenopathy:    He has no cervical adenopathy.  Neurological: He is alert.  Skin: Skin is warm and dry. He is not diaphoretic.  Vitals reviewed.   Imaging: Dg Esophagus  Result Date: 01/15/2018 CLINICAL DATA:  Trouble swallowing for 3-4 months. EXAM: ESOPHOGRAM / BARIUM SWALLOW TECHNIQUE: Combined double contrast and single contrast examination performed using effervescent crystals, thick barium liquid, and thin barium liquid. FLUOROSCOPY TIME:  Fluoroscopy Time:  0.9 minute Radiation Exposure Index (if provided by the fluoroscopic device): 3.7 mGy Number of Acquired Spot Images: 0 COMPARISON:  None. FINDINGS: There was normal pharyngeal anatomy and motility. Contrast flowed freely through the proximal and mid esophagus without evidence of stricture or mass. Distal esophagus just proximal to the gastroesophageal junction demonstrates an irregular stricture measuring 2.5 cm in length. Esophageal motility was normal. No evidence of reflux. No definite hiatal hernia was demonstrated. IMPRESSION: 1. 2.5 cm irregular  stricture of the distal esophagus just proximal to the gastroesophageal junction which may be secondary to an inflammatory or neoplastic etiology. Recommend further evaluation with upper endoscopy. Electronically Signed   By: Kathreen Devoid   On: 01/15/2018 08:34   Nm Pet Image Initial (pi) Skull Base To Thigh  Result Date: 01/20/2018 CLINICAL DATA:  Initial treatment strategy for distal esophageal mass. EXAM: NUCLEAR MEDICINE PET SKULL BASE TO THIGH TECHNIQUE: 6.8 mCi F-18 FDG was injected intravenously. Full-ring PET imaging was performed from the skull base to thigh after the radiotracer. CT data was obtained and used for attenuation correction and anatomic localization. Fasting blood glucose: 88 mg/dl COMPARISON:  Esophagram dated 01/15/2018 FINDINGS: Mediastinal blood pool activity: SUV max 1.9 NECK: No significant abnormal hypermetabolic activity in the neck. Incidental CT findings: none CHEST: In the distal esophagus and extending into the adjacent gastric cardia, a hypermetabolic mass is present with maximum SUV 13.8, corresponding to the area of irregularity  on recent esophagram. No appreciable hypermetabolic intrathoracic adenopathy. 0.7 by 0.8 cm right lower lobe pulmonary nodule on image 99/3 demonstrates central cavitation and has visible but very low-grade activity with maximum SUV of 1.1. A nearby empty portion of the right lower lobe has a maximum SUV of 0.4. Incidental CT findings: Coronary, aortic arch, and branch vessel atherosclerotic vascular disease. ABDOMEN/PELVIS: Multiple hypermetabolic masses are present in the liver. An index lesion in the posterior inferior right hepatic lobe has a maximum standard uptake value of 01.0 and has metabolic activity measuring 4.9 by 4.6 cm. At least 10 other masses are present scattered in the liver. I do not see definite abnormal hypermetabolic activity within the porta hepatis. The patient has marked paucity of intra-adipose tissue as well as the  extensive retained very high density barium reduced diagnostic sensitivity and specificity by causing severe streak artifact and high density that disrupts the attenuation correction of the PET-CT. I did deliberately evaluate non attenuation corrected images as well in order to help contract the effect of the dense barium and attenuation correction. No additional hypermetabolic intra-abdominal lesions are observed. Incidental CT findings: Images are severely degraded by the streak artifact from the dense barium. Delayed transit time is implied as the patient's barium administration was 5 days ago, and most of this barium should have cleared by now. Aortoiliac atherosclerotic vascular disease. Vascular calcifications in both renal hila. SKELETON: No findings of hypermetabolic skeletal lesion. Focus of activity in the left antecubital fossa is felt to be injection related and matches the site of injection. Incidental CT findings: Thoracic kyphosis. IMPRESSION: 1. Hypermetabolic mass in the distal esophagus and adjacent stomach, SUV 13.8, compatible with malignancy. There are over 10 scattered hypermetabolic masses in the liver compatible with widespread hepatic metastatic disease. 2. 8 mm notably cavitary right lower lobe pulmonary nodule is faintly hypermetabolic. Although the maximum SUV is only 1.1, the wall thickness of this lesion is only about 2 mm and accordingly the activity is felt to be abnormal relative to the size of the lesion. This could represent a solitary metastatic lesion to the lung, or a cavitary infectious/inflammatory process. 3. No definite hypermetabolic adenopathy or other metastatic lesions observed. Please note that negative predictive value is adversely affected by the presence of very dense residual barium in the colon (implying delayed clearance of barium from the bowel, given that this barium was administered 5 days ago) and due to the patient's marked paucity of intra-adipose tissue  which complicates separation of adjacent structures. In order to help offset the dense barium, I did carefully reviewed the non-attenuation corrected lesions. 4. Other imaging findings of potential clinical significance: Aortic Atherosclerosis (ICD10-I70.0). Coronary atherosclerosis. Thoracic kyphosis. Electronically Signed   By: Van Clines M.D.   On: 01/20/2018 14:18   US Biopsy (liver)  Result Date: 01/26/2018 CLINICAL DATA:  Esophageal mass with multiple liver lesions suspicious for metastatic disease. The patient presents for ultrasound-guided biopsy of 1 of the liver lesions. EXAM: ULTRASOUND GUIDED CORE BIOPSY OF LIVER MEDICATIONS: 2.0 mg IV Versed; 75 mcg IV Fentanyl Total Moderate Sedation Time: 14 minutes. The patient's level of consciousness and physiologic status were continuously monitored during the procedure by Radiology nursing. PROCEDURE: The procedure, risks, benefits, and alternatives were explained to the patient. Questions regarding the procedure were encouraged and answered. The patient understands and consents to the procedure. A time out was performed prior to initiating the procedure. The operative field was prepped with chlorhexidine in a sterile fashion, and a  sterile drape was applied covering the operative field. A sterile gown and sterile gloves were used for the procedure. Local anesthesia was provided with 1% Lidocaine. Multiple lesions were localized in the liver. A lesion in the right lobe of the liver was chosen for biopsy. Under ultrasound guidance a 17 gauge needle was advanced into the lesion. Three separate coaxial 18 gauge core biopsy samples were obtained and submitted in formalin. After the procedure Gel-Foam pledgets were advanced through the outer needle as the needle was retracted. Post biopsy imaging was performed with ultrasound. COMPLICATIONS: None FINDINGS: A large, hyperechoic lesion within the inferior right lobe of the liver measuring approximately 6 cm  in maximum diameter was targeted. Solid tissue was obtained. IMPRESSION: Ultrasound-guided core biopsy performed a roughly 6 cm mass within the inferior right lobe of the liver. Electronically Signed   By: Aletta Edouard M.D.   On: 01/26/2018 13:32    Labs:  CBC: Recent Labs    01/25/18 1248 01/29/18 1039  WBC 7.9 7.6  HGB 14.2 13.3  HCT 41.2 39.0*  PLT 348 302    COAGS: Recent Labs    01/25/18 1248  INR 0.96  APTT 30    BMP: Recent Labs    01/25/18 1248 01/29/18 1039  NA 136 136  K 4.3 4.2  CL 101 102  CO2 25 25  GLUCOSE 75 98  BUN 35* 27*  CALCIUM 9.5 9.0  CREATININE 0.94 0.79  GFRNONAA >60 >60  GFRAA >60 >60    LIVER FUNCTION TESTS: Recent Labs    01/25/18 1248 01/29/18 1039  BILITOT 0.7 0.5  AST 34 37  ALT 30 31  ALKPHOS 195* 181*  PROT 7.2 6.1*  ALBUMIN 4.3 3.6    Assessment and Plan:  Metastatic esophageal carcinoma and need for percutaneous gastrostomy tube for nutrition.  Due to advanced nature of mass with significant stricture by endoscopy, will require a direct stick balloon retention gastrostomy tube placement.  Risks and benefits discussed with the patient and his significant other, Rudi Coco, including, but not limited to the need for a barium enema during the procedure, bleeding, infection, peritonitis, or damage to adjacent structures. All questions were answered, patient is agreeable to proceed. Consent signed and in chart.  Thank you for this interesting consult.  I greatly enjoyed meeting SALAAM BATTERSHELL and look forward to participating in their care.  A copy of this report was sent to the requesting provider on this date.  Electronically Signed: Azzie Roup, MD 01/29/2018, 1:08 PM   I spent a total of 15 Minutes in face to face in clinical consultation, greater than 50% of which was counseling/coordinating care for gastrostomy tube placement.

## 2018-01-29 NOTE — Progress Notes (Signed)
Patient to MRI for scan. Will return here and be discharged to home.

## 2018-01-29 NOTE — Progress Notes (Signed)
Pt here for follow up with family-who gave most information. Stated fentynyl patch effect for pain " and calms him down a little " awaiting results of testing today

## 2018-01-29 NOTE — Progress Notes (Signed)
Thank you.  Loistine Simas, MD

## 2018-01-30 ENCOUNTER — Encounter: Payer: Self-pay | Admitting: Oncology

## 2018-01-30 DIAGNOSIS — E43 Unspecified severe protein-calorie malnutrition: Secondary | ICD-10-CM | POA: Insufficient documentation

## 2018-01-30 DIAGNOSIS — C159 Malignant neoplasm of esophagus, unspecified: Secondary | ICD-10-CM | POA: Insufficient documentation

## 2018-01-30 MED ORDER — PROCHLORPERAZINE MALEATE 10 MG PO TABS: 10.0000 mg | ORAL_TABLET | Freq: Four times a day (QID) | ORAL | 1 refills | Status: AC | PRN

## 2018-01-30 MED ORDER — LIDOCAINE-PRILOCAINE 2.5-2.5 % EX CREA: TOPICAL_CREAM | CUTANEOUS | 3 refills | Status: DC

## 2018-01-30 MED ORDER — ONDANSETRON HCL 8 MG PO TABS: 8.0000 mg | ORAL_TABLET | Freq: Two times a day (BID) | ORAL | 1 refills | Status: DC | PRN

## 2018-01-30 NOTE — Progress Notes (Signed)
START ON PATHWAY REGIMEN - Gastroesophageal     A cycle is every 14 days:     Oxaliplatin      Leucovorin      5-Fluorouracil      5-Fluorouracil   **Always confirm dose/schedule in your pharmacy ordering system**    Patient Characteristics: Distant Metastases (cM1/pM1) / Locally Recurrent Disease, Adenocarcinoma - Esophageal, GE Junction, and Gastric, First Line, HER2 Negative / Unknown Histology: Adenocarcinoma Disease Classification: GE Junction Therapeutic Status: Distant Metastases (No Additional Staging) Line of Therapy: First Line HER2 Status: Awaiting Test Results Intent of Therapy: Non-Curative / Palliative Intent, Discussed with Patient 

## 2018-01-30 NOTE — Progress Notes (Signed)
Hematology/Oncology Consult note [facility] [phone] [phone]   Patient Care Team: Kirk Ruths, MD as PCP - General (Internal Medicine) Clent Jacks, RN as Registered Nurse  REFERRING PROVIDER: Kirk Ruths, MD  Lollie Sails MD   REASON FOR VISIT Follow up for treatment of metastatic esophageal cancer.  HISTORY OF PRESENTING ILLNESS:  Evan Mason is a  72 y.o.  male with PMH listed below who was referred to me for evaluation of Esophageal mass. Patient recently presented emergency room of swallowing 40 pound weight loss. 01/19/2018 upper endoscopy showedFungating/infiltrating mass at the GE junction, near complete obstructing. Biopsies taken. Biopsy showed at least high-grade dysplasia, cannot exclude adenocarcinoma in this very small sample. Patient has had a PET scan done which showed hypermetabolic mass in the distal esophagus and adjacent stomach, SUV 13.8, patible with malignancy.  There are over 10 scattered hypermetabolic masses in the liver compatible with widespread hepatic metastatic disease.  There is also 8 mm multiple cavitary right lower lobe pulmonary nodule is faintly hypermetabolic.   INTERVAL HISTORY Evan Mason is a 72 y.o. male who has above history reviewed by me today presents for follow up visit for management of metastatic esophageal cancer.  She is accompanied by 2 daughters, wife and son-in-law. Patient does not talk very much.  Continue to have swallowing difficulty and pain. No nausea or vomiting.  Family members report that patient has been more forgetful/ confused for the past 2 months.  Review of Systems  Constitutional: Positive for malaise/fatigue and weight loss. Negative for chills and fever.  HENT: Negative for ear discharge and hearing loss.   Eyes: Negative for double vision.  Respiratory: Negative for cough and sputum production.   Cardiovascular: Negative  for chest pain, palpitations and orthopnea.  Gastrointestinal: Negative for blood in stool, diarrhea and nausea.  Genitourinary: Negative for dysuria.  Musculoskeletal: Negative for myalgias.  Skin: Negative for rash.  Neurological: Negative for dizziness and tremors.       Patient is alert and awake.  Patient is orientated to location, people, and time.  He knows the dates except that he is confused with the year.  Endo/Heme/Allergies: Does not bruise/bleed easily.  Psychiatric/Behavioral: Negative for depression. The patient is not nervous/anxious.        Flat affect    MEDICAL HISTORY:  Past Medical History:  Diagnosis Date  . Basal cell carcinoma   . Esophageal mass 01/20/2018   From PET scan order  . Hyperlipidemia   . Hypertension     SURGICAL HISTORY: Past Surgical History:  Procedure Laterality Date  . COLONOSCOPY WITH PROPOFOL N/A 12/27/2015   Procedure: COLONOSCOPY WITH PROPOFOL;  Surgeon: Manya Silvas, MD;  Location: [facility] ENDOSCOPY;  Service: Endoscopy;  Laterality: N/A;  . ESOPHAGOGASTRODUODENOSCOPY (EGD) WITH PROPOFOL N/A 01/19/2018   Procedure: ESOPHAGOGASTRODUODENOSCOPY (EGD) WITH PROPOFOL;  Surgeon: Lollie Sails, MD;  Location[facility] ENDOSCOPY;  Service: Endoscopy;  Laterality: N/A;  . IR GASTROSTOMY TUBE MOD SED  01/29/2018  . MOHS SURGERY      SOCIAL HISTORY: Social History   Socioeconomic History  . Marital status: Divorced    Spouse name: Not on file  . Number of children: Not on file  . Years of education: Not on file  . Highest education level: Not on file  Occupational History  . Not on file  Social Needs  . Financial resource strain: Not on file  . Food insecurity:    Worry: Not  on file    Inability: Not on file  . Transportation needs:    Medical: Not on file    Non-medical: Not on file  Tobacco Use  . Smoking status: Current Every Day Smoker    Packs/day: 0.25    Types: Cigarettes  . Smokeless tobacco: Never Used  Substance and  Sexual Activity  . Alcohol use: No  . Drug use: No  . Sexual activity: Not on file  Lifestyle  . Physical activity:    Days per week: Not on file    Minutes per session: Not on file  . Stress: Not on file  Relationships  . Social connections:    Talks on phone: Not on file    Gets together: Not on file    Attends religious service: Not on file    Active member of club or organization: Not on file    Attends meetings of clubs or organizations: Not on file    Relationship status: Not on file  . Intimate partner violence:    Fear of current or ex partner: Not on file    Emotionally abused: Not on file    Physically abused: Not on file    Forced sexual activity: Not on file  Other Topics Concern  . Not on file  Social History Narrative  . Not on file    FAMILY HISTORY: No family history on file.  ALLERGIES:  has No Known Allergies.  MEDICATIONS:  Current Outpatient Medications  Medication Sig Dispense Refill  . fentaNYL (DURAGESIC - DOSED MCG/HR) 25 MCG/HR patch Place 1 patch (25 mcg total) onto the skin every 3 (three) days. 5 patch 0  . amLODipine (NORVASC) 10 MG tablet Take 10 mg by mouth daily.    Marland Kitchen aspirin EC 81 MG tablet Take 81 mg by mouth daily.    Marland Kitchen dexlansoprazole (DEXILANT) 60 MG capsule Take 60 mg by mouth daily.    . hydrochlorothiazide (HYDRODIURIL) 25 MG tablet Take 25 mg by mouth daily.    Marland Kitchen lisinopril (PRINIVIL,ZESTRIL) 40 MG tablet Take 40 mg by mouth daily.    . Multiple Vitamin (MULTIVITAMIN WITH MINERALS) TABS tablet Take 1 tablet by mouth daily.    . Omega-3 Fatty Acids (FISH OIL PO) Take 1 capsule by mouth daily.    . sildenafil (REVATIO) 20 MG tablet Take 40-100 mg by mouth daily as needed (erectile dysfunction).      No current facility-administered medications for this visit.      PHYSICAL EXAMINATION: ECOG PERFORMANCE STATUS: 1 - Symptomatic but completely ambulatory Vitals:   01/29/18 1116  BP: 130/75  Pulse: 81  Resp: 20  Temp: (!) 95  F (35 C)   Filed Weights   01/29/18 1123  Weight: 122 lb 6.4 oz (55.5 kg)    Physical Exam  Constitutional: He is oriented to person, place, and time and well-developed, well-nourished, and in no distress. No distress.  HENT:  Head: Normocephalic and atraumatic.  Mouth/Throat: Oropharynx is clear and moist. No oropharyngeal exudate.  Eyes: EOM are normal.  Neck: Normal range of motion. Neck supple. No thyromegaly present.  Cardiovascular: Normal rate and regular rhythm.  No murmur heard. Pulmonary/Chest: Effort normal and breath sounds normal. He has no wheezes.  Abdominal: Soft. Bowel sounds are normal. He exhibits no distension. There is no rebound.  Musculoskeletal: Normal range of motion. He exhibits no edema.  Lymphadenopathy:    He has no cervical adenopathy.  Neurological: He is alert and oriented to person, place,  and time. No cranial nerve deficit.  Skin: Skin is warm and dry.     LABORATORY DATA:  I have reviewed the data as listed Lab Results  Component Value Date   WBC 7.6 01/29/2018   HGB 13.3 01/29/2018   HCT 39.0 (L) 01/29/2018   MCV 90.7 01/29/2018   PLT 302 01/29/2018   Recent Labs    01/25/18 1248 01/29/18 1039  NA 136 136  K 4.3 4.2  CL 101 102  CO2 25 25  GLUCOSE 75 98  BUN 35* 27*  CREATININE 0.94 0.79  CALCIUM 9.5 9.0  GFRNONAA >60 >60  GFRAA >60 >60  PROT 7.2 6.1*  ALBUMIN 4.3 3.6  AST 34 37  ALT 30 31  ALKPHOS 195* 181*  BILITOT 0.7 0.5     RADIOGRAPHIC STUDIES: I have personally reviewed the radiological images as listed and agreed with the findings in the report. PET scan 01/20/2018 1. Hypermetabolic mass in the distal esophagus and adjacent stomach, SUV 13.8, compatible with malignancy. There are over 10 scattered hypermetabolic masses in the liver compatible with widespread hepatic metastatic disease. 2. 8 mm notably cavitary right lower lobe pulmonary nodule is faintly hypermetabolic. Although the maximum SUV is only  1.1, the wall thickness of this lesion is only about 2 mm and accordingly the activity is felt to be abnormal relative to the size of the lesion. This could represent a solitary metastatic lesion to the lung, or a cavitary infectious/inflammatory process. 3. No definite hypermetabolic adenopathy or other metastatic lesions observed. Please note that negative predictive value is adversely affected by the presence of very dense residual barium in the colon (implying delayed clearance of barium from the bowel, given that this barium was administered 5 days ago) and due to the patient's marked paucity of intra-adipose tissue which complicates separation of adjacent structures. In order to help offset the dense barium, I did carefully reviewed the non-attenuation corrected lesions. 4. Other imaging findings of potential clinical significance: Aortic Atherosclerosis (ICD10-I70.0). Coronary atherosclerosis. Thoracic kyphosis.  ASSESSMENT & PLAN:  1. Esophageal cancer, stage IV ([place])   2. Confusion   3. Goals of care, counseling/discussion   4. Liver metastasis ([place])   5. Severe protein-calorie malnutrition ([place])    # PET scan results of the biopsy report were discussed with patient.  EGD biopsy reports only showed high-grade dysplasia, liver biopsy is compatible with metastatic adenocarcinoma with esophageal origin. Discussed with patient that she has stage IV esophageal cancer, and this is not curable.  Goal of care is palliative.  Recommend systemic chemotherapy with FOLFOX every 2 weeks. I explained to the patient the risks and benefits of chemotherapy including all but not limited to hair loss, mouth sore, hand-and-foot syndrome, nausea, vomiting, diarrhea, low blood counts, bleeding, and risk of life threatening infection and even death, secondary malignancy etc.  Risk of neuropathy is associated with Oxaliplatin. Patient and family voiced understanding and willing to proceed with  chemotherapy treatment.  CODE STATUS was discussed with patient and he wishes not to receive any CPR or life sustaining treatment.  I encourage patient discussed with his family members and point power of attorney for medical decision.   # Chemotherapy education; Medi port placement. Hopefully the planned start chemotherapy next week. Antiemetics-Zofran and Compazine; EMLA cream sent to pharmacy  # Follow up with radiation oncologist for palliative radiation. #Esophageal obstruction/severe malnutrition/weight loss: He has appointment today to have gastrostomy tube insertion.  And he will meet dietitian next Monday.  #  Confusion/? dementia: Obtain MRI of brain with and without contrast to further evaluate any CNS involvement.  Check ammonia level.  We will also check folate and B12 level to rule out any reversible etiology of dementia.  #Pain: Continue fentanyl patch 25 mcg/h every 72 hours.  # Will send Omniseq assay and HER-2 staining   All questions were answered. The patient knows to call the clinic with any problems questions or concerns.  Return of visit: prior to starting cycle 1 FOLFOX.  Total face to face encounter time for this patient visit was 45 min. >50% of the time was  spent in counseling and coordination of care.    Earlie Server, MD, PhD Hematology Oncology [facility] at [facility] Pager- [phone] 01/30/2018

## 2018-02-01 ENCOUNTER — Other Ambulatory Visit (INDEPENDENT_AMBULATORY_CARE_PROVIDER_SITE_OTHER): Payer: Self-pay | Admitting: Vascular Surgery

## 2018-02-01 ENCOUNTER — Inpatient Hospital Stay: Payer: Medicare HMO

## 2018-02-01 ENCOUNTER — Telehealth: Payer: Self-pay | Admitting: *Deleted

## 2018-02-01 ENCOUNTER — Inpatient Hospital Stay (HOSPITAL_BASED_OUTPATIENT_CLINIC_OR_DEPARTMENT_OTHER): Payer: Medicare HMO | Admitting: Nurse Practitioner

## 2018-02-01 ENCOUNTER — Other Ambulatory Visit: Payer: Self-pay | Admitting: Oncology

## 2018-02-01 ENCOUNTER — Other Ambulatory Visit: Payer: Self-pay | Admitting: Nurse Practitioner

## 2018-02-01 VITALS — BP 150/75 | HR 89 | Temp 97.4°F | Resp 20 | Wt 126.0 lb

## 2018-02-01 DIAGNOSIS — R410 Disorientation, unspecified: Secondary | ICD-10-CM

## 2018-02-01 DIAGNOSIS — R918 Other nonspecific abnormal finding of lung field: Secondary | ICD-10-CM

## 2018-02-01 DIAGNOSIS — I2510 Atherosclerotic heart disease of native coronary artery without angina pectoris: Secondary | ICD-10-CM

## 2018-02-01 DIAGNOSIS — R4182 Altered mental status, unspecified: Secondary | ICD-10-CM

## 2018-02-01 DIAGNOSIS — Z7982 Long term (current) use of aspirin: Secondary | ICD-10-CM

## 2018-02-01 DIAGNOSIS — R1310 Dysphagia, unspecified: Secondary | ICD-10-CM | POA: Diagnosis not present

## 2018-02-01 DIAGNOSIS — C159 Malignant neoplasm of esophagus, unspecified: Secondary | ICD-10-CM

## 2018-02-01 DIAGNOSIS — R070 Pain in throat: Secondary | ICD-10-CM | POA: Diagnosis not present

## 2018-02-01 DIAGNOSIS — C787 Secondary malignant neoplasm of liver and intrahepatic bile duct: Secondary | ICD-10-CM

## 2018-02-01 DIAGNOSIS — Z7189 Other specified counseling: Secondary | ICD-10-CM

## 2018-02-01 DIAGNOSIS — R5383 Other fatigue: Secondary | ICD-10-CM | POA: Diagnosis not present

## 2018-02-01 DIAGNOSIS — Z931 Gastrostomy status: Secondary | ICD-10-CM | POA: Diagnosis not present

## 2018-02-01 DIAGNOSIS — E43 Unspecified severe protein-calorie malnutrition: Secondary | ICD-10-CM | POA: Diagnosis not present

## 2018-02-01 DIAGNOSIS — I10 Essential (primary) hypertension: Secondary | ICD-10-CM

## 2018-02-01 DIAGNOSIS — R5381 Other malaise: Secondary | ICD-10-CM

## 2018-02-01 DIAGNOSIS — E119 Type 2 diabetes mellitus without complications: Secondary | ICD-10-CM

## 2018-02-01 DIAGNOSIS — I700 Atherosclerosis of aorta: Secondary | ICD-10-CM

## 2018-02-01 DIAGNOSIS — Z85828 Personal history of other malignant neoplasm of skin: Secondary | ICD-10-CM

## 2018-02-01 DIAGNOSIS — Z5111 Encounter for antineoplastic chemotherapy: Secondary | ICD-10-CM | POA: Diagnosis not present

## 2018-02-01 DIAGNOSIS — E785 Hyperlipidemia, unspecified: Secondary | ICD-10-CM

## 2018-02-01 DIAGNOSIS — F17210 Nicotine dependence, cigarettes, uncomplicated: Secondary | ICD-10-CM

## 2018-02-01 DIAGNOSIS — R8290 Unspecified abnormal findings in urine: Secondary | ICD-10-CM

## 2018-02-01 DIAGNOSIS — C155 Malignant neoplasm of lower third of esophagus: Secondary | ICD-10-CM | POA: Diagnosis not present

## 2018-02-01 DIAGNOSIS — R634 Abnormal weight loss: Secondary | ICD-10-CM

## 2018-02-01 DIAGNOSIS — Z79899 Other long term (current) drug therapy: Secondary | ICD-10-CM

## 2018-02-01 LAB — COMPREHENSIVE METABOLIC PANEL
ALBUMIN: 3.7 g/dL (ref 3.5–5.0)
ALT: 28 U/L (ref 17–63)
AST: 35 U/L (ref 15–41)
Alkaline Phosphatase: 191 U/L — ABNORMAL HIGH (ref 38–126)
Anion gap: 8 (ref 5–15)
BUN: 26 mg/dL — ABNORMAL HIGH (ref 6–20)
CHLORIDE: 98 mmol/L — AB (ref 101–111)
CO2: 27 mmol/L (ref 22–32)
Calcium: 9.1 mg/dL (ref 8.9–10.3)
Creatinine, Ser: 0.77 mg/dL (ref 0.61–1.24)
GFR calc Af Amer: >60 mL/min (ref >60)
GLUCOSE: 147 mg/dL — AB (ref 65–99)
POTASSIUM: 4.4 mmol/L (ref 3.5–5.1)
SODIUM: 133 mmol/L — AB (ref 135–145)
Total Bilirubin: 0.5 mg/dL (ref 0.3–1.2)
Total Protein: 6.3 g/dL — ABNORMAL LOW (ref 6.5–8.1)

## 2018-02-01 LAB — CBC WITH DIFFERENTIAL/PLATELET
BASOS ABS: 0 10*3/uL (ref 0–0.1)
BASOS PCT: 0 %
EOS ABS: 0 10*3/uL (ref 0–0.7)
Eosinophils Relative: 0 %
HCT: 38.6 % — ABNORMAL LOW (ref 40.0–52.0)
Hemoglobin: 13.4 g/dL (ref 13.0–18.0)
Lymphocytes Relative: 7 %
Lymphs Abs: 0.6 10*3/uL — ABNORMAL LOW (ref 1.0–3.6)
MCH: 31.1 pg (ref 26.0–34.0)
MCHC: 34.7 g/dL (ref 32.0–36.0)
MCV: 89.7 fL (ref 80.0–100.0)
MONO ABS: 0.6 10*3/uL (ref 0.2–1.0)
MONOS PCT: 7 %
Neutro Abs: 7.4 10*3/uL — ABNORMAL HIGH (ref 1.4–6.5)
Neutrophils Relative %: 86 %
Platelets: 306 10*3/uL (ref 150–440)
RBC: 4.3 MIL/uL — ABNORMAL LOW (ref 4.40–5.90)
RDW: 14.3 % (ref 11.5–14.5)
WBC: 8.6 10*3/uL (ref 3.8–10.6)

## 2018-02-01 LAB — URINALYSIS, ROUTINE W REFLEX MICROSCOPIC
BACTERIA UA: NONE SEEN
BILIRUBIN URINE: NEGATIVE
GLUCOSE, UA: 50 mg/dL — AB
Hgb urine dipstick: NEGATIVE
KETONES UR: NEGATIVE mg/dL
Leukocytes, UA: NEGATIVE
Nitrite: NEGATIVE
PH: 7 (ref 5.0–8.0)
PROTEIN: 30 mg/dL — AB
Specific Gravity, Urine: 1.018 (ref 1.005–1.030)
Squamous Epithelial / LPF: NONE SEEN

## 2018-02-01 LAB — TSH: TSH: 1.692 u[IU]/mL (ref 0.350–4.500)

## 2018-02-01 LAB — URINE DRUG SCREEN, QUALITATIVE (ARMC ONLY)
Amphetamines, Ur Screen: NOT DETECTED
BARBITURATES, UR SCREEN: NOT DETECTED
BENZODIAZEPINE, UR SCRN: NOT DETECTED
CANNABINOID 50 NG, UR ~~LOC~~: NOT DETECTED
Cocaine Metabolite,Ur ~~LOC~~: NOT DETECTED
MDMA (Ecstasy)Ur Screen: NOT DETECTED
Methadone Scn, Ur: NOT DETECTED
Opiate, Ur Screen: NOT DETECTED
PHENCYCLIDINE (PCP) UR S: NOT DETECTED
Tricyclic, Ur Screen: NOT DETECTED

## 2018-02-01 LAB — ETHANOL

## 2018-02-01 LAB — AMMONIA: Ammonia: <9 umol/L — ABNORMAL LOW (ref 9–35)

## 2018-02-01 MED ORDER — OSMOLITE 1.5 CAL PO LIQD: ORAL | 0 refills | Status: AC

## 2018-02-01 NOTE — Progress Notes (Signed)
Patient here as an acute add on for symptom management. Daughter states that she went to his house to pick him up this morning for his chemotherapy teaching class, and he was not dressed and was disoriented. She is very concerned because this is not his baseline. She states that it took him an hour to get dressed and ready to come here today. Patient seems to be very irritated that she has brought him here to see the NP for his altered mental status.

## 2018-02-01 NOTE — Telephone Encounter (Signed)
Daughter called reporting that patient is very confused this morning and she is afraid to leave him alone. He is not going to make it to the chemotherapy class this morning. I have scheduled him to see [facility] NP L. Allen. She said she wants him seen as this is unusual for him and will be here as soon as she can get him dressed

## 2018-02-01 NOTE — Progress Notes (Signed)
Symptom Management Consult note [facility]  Telephone:([phone] [phone]  Patient Care Team: Kirk Ruths, MD as PCP - General (Internal Medicine) Clent Jacks, RN as Registered Nurse   Name of the patient: Evan Mason  [id number]  1946/03/18   Date of visit: 02/01/18  Diagnosis- Metastatic esophageal Cancer  Chief complaint/ Reason for visit- Confusion/Fluctuating Mood  Heme/Onc history: Patient last evaluated by primary oncologist, Dr. Tasia Catchings, on 01/29/18.  He was initially referred for evaluation of esophageal mass after being seen in emergency room for difficulty swallowing and 40 pound weight loss.    01/15/2018- DG Esophagus - 1. 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 Recommend further evaluation with upper endoscopy.  01/17/2018- PET:   1. Hypermetabolic mass in the distal esophagus and adjacent stomach, SUV 13.8, compatible with malignancy. There are over 10 scattered hypermetabolic masses in the liver compatible with widespread hepatic metastatic disease. 2. 8 mm notably cavitary right lower lobe pulmonary nodule is faintly hypermetabolic. Although the maximum SUV is only 1.1, the wall thickness of this lesion is only about 2 mm and accordingly the activity is felt to be abnormal relative to the size of the lesion. This could represent a solitary metastatic lesion to the lung, or a cavitary infectious/inflammatory process. 3. No definite hypermetabolic adenopathy or other metastatic lesions observed. Please note that negative predictive value is adversely affected by the presence of very dense residual barium in the colon (implying delayed clearance of barium from the bowel, given that this barium was administered 5 days ago) and due to the patient's marked paucity of intra-adipose tissue which complicates  separation of adjacent structures. In order to help offset the dense barium, I did carefully reviewed the non-attenuation corrected lesions.  4. Other imaging findings of potential clinical significance: Aortic Atherosclerosis (ICD10-I70.0). Coronary atherosclerosis. Thoracic Kyphosis.  01/19/18 upper endoscopy showed infiltrating mass at the GE junction was nearly completely obstructing.  Biopsies performed.  01/26/18- Pathology:  Liver, right lobe: Ultrasound-guided core biopsy: -Metastatic adenocarcinoma, moderately differentiated, with extensive necrosis.  Adenocarcinoma is present, morphologically compatible with origin from distal esophagus/gastroesophageal junction/cardia.  Sufficient tumor for ancillary testing.  01/29/2018- Percutaneous Gastrostomy Tube Placed by IR  01/29/2018- MRI Brain W WO Contrast:  - No evidence of metastatic disease. - Chronic small-vessel ischemic changes of the pons and cerebral hemispheric white matter. No acute insult. PET showed hypermetabolic mass in the distal esophagus and adjacent stomach with SUV of 13.8 compatible with malignancy.  Additionally, over 10 scattered hypermetabolic masses in the liver compatible with widespread hepatic metastases metastatic disease.  Also, 8 mm multiple cavitary right lower lobe pulmonary nodules that were faintly hypermetabolic.  Interval history- Patient presents to Symptom Management clinic for confusion. Symptoms have been present for several weeks and per family, have had gradual onset and gradual worsening since time of diagnosis and plans for treatment. They state that symptoms fluctuate and are worse in morning and late evening. They describe symptoms as mild to moderate. Unsure of anything that makes symptoms better. They feel that symptoms are exacerbated by stress, cancer diagnosis, frequent medical appointments, and being alone during the day.  Evaluation to date: MRI brain on 01/29/18.  Patient denies changes in sleep  patterns, denies seeing, hearing, or feeling things that aren't there. Family denies suspicion that patient is responding to internal stimuli. Patient and family denies history  of head trauma or recent injury. Denies history of alcohol or drug use. He has started fentanyl patches which he states are controlling his pain. Patient denies present or past suicidal ideation or homicidal ideation. Patient and family deny history of depression or anxiety.   History obtained from patient and family separately then together.   Patient's family reports that approximately 6 months ago, patient 'up and walked out of his home'. They describe that his prior home was left untouched when he left and that his clothes, belongings, etc. Were left as they were. At that time he moved in with his significant other and lives with her now. They became acutely concerned that his confusion was worsening this morning when they went to patient's home to bring him to today's appointments. Family states that it was difficult to convince him to leave the home and he appeared confused about where they were going or why. Also concerned of difficulty selecting clothing. Family reports that his significant other was at work today and he is alone for a portion of the day. They deny concerns for his safety and endorse concern that he will be willing to get on van to travel to appointments. He is currently scheduled to have chemo class and g-tube class today in clinic and have a port placed on 02/02/17 with evaluation for initiation of chemotherapy on 02/03/18.   ECOG FS:0 - Asymptomatic  Review of systems- Review of Systems  Constitutional: Positive for weight loss. Negative for chills, diaphoresis, fever and malaise/fatigue.  HENT: Positive for sore throat. Negative for congestion, ear discharge, ear pain, hearing loss, nosebleeds, sinus pain and tinnitus.        Esophageal pain  Eyes: Negative.   Respiratory: Negative.  Negative for  stridor.   Cardiovascular: Negative for chest pain, palpitations, orthopnea, claudication, leg swelling and PND.  Gastrointestinal: Negative.  Negative for abdominal pain, blood in stool, constipation, diarrhea, heartburn, melena, nausea and vomiting.       Difficulty swallowing; only tolerates liquids  Genitourinary: Negative.   Musculoskeletal: Negative.   Skin: Negative.   Neurological: Negative for dizziness, tingling, tremors, sensory change, speech change, focal weakness, seizures, loss of consciousness, weakness and headaches.  Endo/Heme/Allergies: Negative.   Psychiatric/Behavioral: Positive for memory loss (pt denies; family endorses). Negative for depression, hallucinations, substance abuse and suicidal ideas. The patient is not nervous/anxious and does not have insomnia.     Current treatment- plan to initiate FOLFOX chemotherapy on 02/03/2018.   No Known Allergies  Past Medical History:  Diagnosis Date  . Basal cell carcinoma   . Esophageal mass 01/20/2018   From PET scan order  . Hyperlipidemia   . Hypertension      Past Surgical History:  Procedure Laterality Date  . COLONOSCOPY WITH PROPOFOL N/A 12/27/2015   Procedure: COLONOSCOPY WITH PROPOFOL;  Surgeon: Manya Silvas, MD;  Location: [facility] ENDOSCOPY;  Service: Endoscopy;  Laterality: N/A;  . ESOPHAGOGASTRODUODENOSCOPY (EGD) WITH PROPOFOL N/A 01/19/2018   Procedure: ESOPHAGOGASTRODUODENOSCOPY (EGD) WITH PROPOFOL;  Surgeon: Lollie Sails, MD;  Location: [facility] ENDOSCOPY;  Service: Endoscopy;  Laterality: N/A;  . IR GASTROSTOMY TUBE MOD SED  01/29/2018  . MOHS SURGERY      Social History   Socioeconomic History  . Marital status: Divorced    Spouse name: Not on file  . Number of children: Not on file  . Years of education: Not on file  . Highest education level: Not on file  Occupational History  . Not on file  Social Needs  . Financial resource strain: Not on file  . Food insecurity:    Worry: Not on file      Inability: Not on file  . Transportation needs:    Medical: Not on file    Non-medical: Not on file  Tobacco Use  . Smoking status: Current Every Day Smoker    Packs/day: 0.25    Types: Cigarettes  . Smokeless tobacco: Never Used  Substance and Sexual Activity  . Alcohol use: No  . Drug use: No  . Sexual activity: Not on file  Lifestyle  . Physical activity:    Days per week: Not on file    Minutes per session: Not on file  . Stress: Not on file  Relationships  . Social connections:    Talks on phone: Not on file    Gets together: Not on file    Attends religious service: Not on file    Active member of club or organization: Not on file    Attends meetings of clubs or organizations: Not on file    Relationship status: Not on file  . Intimate partner violence:    Fear of current or ex partner: Not on file    Emotionally abused: Not on file    Physically abused: Not on file    Forced sexual activity: Not on file  Other Topics Concern  . Not on file  Social History Narrative  . Not on file    No family history on file.   Current Outpatient Medications:  .  amLODipine (NORVASC) 10 MG tablet, Take 10 mg by mouth daily., Disp: , Rfl:  .  aspirin EC 81 MG tablet, Take 81 mg by mouth daily., Disp: , Rfl:  .  dexlansoprazole (DEXILANT) 60 MG capsule, Take 60 mg by mouth daily., Disp: , Rfl:  .  fentaNYL (DURAGESIC - DOSED MCG/HR) 25 MCG/HR patch, Place 1 patch (25 mcg total) onto the skin every 3 (three) days., Disp: 5 patch, Rfl: 0 .  hydrochlorothiazide (HYDRODIURIL) 25 MG tablet, Take 25 mg by mouth daily., Disp: , Rfl:  .  lidocaine-prilocaine (EMLA) cream, Apply to affected area once, Disp: 30 g, Rfl: 3 .  lisinopril (PRINIVIL,ZESTRIL) 40 MG tablet, Take 40 mg by mouth daily., Disp: , Rfl:  .  Multiple Vitamin (MULTIVITAMIN WITH MINERALS) TABS tablet, Take 1 tablet by mouth daily., Disp: , Rfl:  .  Omega-3 Fatty Acids (FISH OIL PO), Take 1 capsule by mouth daily.,  Disp: , Rfl:  .  ondansetron (ZOFRAN) 8 MG tablet, Take 1 tablet (8 mg total) by mouth 2 (two) times daily as needed for refractory nausea / vomiting. Start on day 3 after chemotherapy., Disp: 30 tablet, Rfl: 1 .  prochlorperazine (COMPAZINE) 10 MG tablet, Take 1 tablet (10 mg total) by mouth every 6 (six) hours as needed (Nausea or vomiting)., Disp: 30 tablet, Rfl: 1 .  sildenafil (REVATIO) 20 MG tablet, Take 40-100 mg by mouth daily as needed (erectile dysfunction). , Disp: , Rfl:   Physical exam:  Vitals:   02/01/18 0945  BP: (!) 150/75  Pulse: 89  Resp: 20  Temp: (!) 97.4 F (36.3 C)  TempSrc: Tympanic  SpO2: 100%  Weight: 126 lb (57.2 kg)   Physical Exam  Constitutional: He is oriented to person, place, and time.  Frail, chronically ill appearing male, in wheelchair. Accompanied. NAD.   HENT:  Head: Normocephalic and atraumatic.  Mouth/Throat: Oropharynx is clear and moist. No oropharyngeal exudate.  Eyes: Pupils are equal, round, and reactive to light. Conjunctivae and EOM are normal.  Neck: Normal range of motion. Neck supple.  Cardiovascular: Normal rate and regular rhythm.  Pulmonary/Chest: Effort normal and breath sounds normal. No respiratory distress. He has no wheezes.  Abdominal: Soft. Bowel sounds are normal. He exhibits no distension. There is no tenderness.  G tube present  Musculoskeletal: He exhibits no edema or deformity.  Lymphadenopathy:    He has no cervical adenopathy.  Neurological: He is alert and oriented to person, place, and time. He has normal strength. He is not agitated and not disoriented. He displays no weakness, no tremor, facial symmetry and normal speech. No cranial nerve deficit. Gait normal. Coordination and gait normal. GCS score is 15.  Skin: Skin is warm and dry.  Psychiatric: His affect is blunt. He does not express impulsivity. He expresses no homicidal and no suicidal ideation. He is apathetic. He exhibits normal recent memory and normal  remote memory. He has a flat affect.     CMP Latest Ref Rng & Units 02/01/2018  Glucose 65 - 99 mg/dL 147(H)  BUN 6 - 20 mg/dL 26(H)  Creatinine 0.61 - 1.24 mg/dL 0.77  Sodium 135 - 145 mmol/L 133(L)  Potassium 3.5 - 5.1 mmol/L 4.4  Chloride 101 - 111 mmol/L 98(L)  CO2 22 - 32 mmol/L 27  Calcium 8.9 - 10.3 mg/dL 9.1  Total Protein 6.5 - 8.1 g/dL 6.3(L)  Total Bilirubin 0.3 - 1.2 mg/dL 0.5  Alkaline Phos 38 - 126 U/L 191(H)  AST 15 - 41 U/L 35  ALT 17 - 63 U/L 28   CBC Latest Ref Rng & Units 02/01/2018  WBC 3.8 - 10.6 K/uL 8.6  Hemoglobin 13.0 - 18.0 g/dL 13.4  Hematocrit 40.0 - 52.0 % 38.6(L)  Platelets 150 - 440 K/uL 306   Urinalysis    Component Value Date/Time   COLORURINE YELLOW (A) 02/01/2018 1005   APPEARANCEUR CLEAR (A) 02/01/2018 1005   LABSPEC >1.018 02/01/2018 1005   PHURINE 7.0 02/01/2018 1005   GLUCOSEU 50 (A) 02/01/2018 1005   HGBUR NEGATIVE 02/01/2018 1005   BILIRUBINUR NEGATIVE 02/01/2018 1005   KETONESUR NEGATIVE 02/01/2018 1005   PROTEINUR 30 (A) 02/01/2018 1005   NITRITE NEGATIVE 02/01/2018 1005   LEUKOCYTESUR NEGATIVE 02/01/2018 1005   Ethanol- <10 TSH- 1.692 Ammonia- <9   Dg Eye Foreign Body  Result Date: 01/29/2018 CLINICAL DATA:  Metal working/exposure; clearance prior to MRI EXAM: ORBITS FOR FOREIGN BODY - 2 VIEW COMPARISON:  None. FINDINGS: There is no evidence of metallic foreign body within the orbits. No significant bone abnormality identified. IMPRESSION: No evidence of metallic foreign body within the orbits. Electronically Signed   By: Lajean Manes M.D.   On: 01/29/2018 16:28   Mr Jeri Cos CW Contrast  Result Date: 01/29/2018 CLINICAL DATA:  Metastatic esophageal cancer. Assess for brain metastases. EXAM: MRI HEAD WITHOUT AND WITH CONTRAST TECHNIQUE: Multiplanar, multiecho pulse sequences of the brain and surrounding structures were obtained without and with intravenous contrast. CONTRAST:  41mL MULTIHANCE GADOBENATE DIMEGLUMINE 529 MG/ML IV  SOLN COMPARISON:  None. FINDINGS: Brain: Diffusion imaging does not show any acute or subacute infarction. There are extensive chronic small-vessel ischemic changes of the pons. No focal cerebellar insult. Cerebral hemispheres show moderate chronic small-vessel ischemic changes of the deep and subcortical white matter. No cortical or large vessel territory infarction. No mass lesion, hemorrhage, hydrocephalus or extra-axial collection. No abnormal contrast enhancement. Vascular: Major vessels at the base of  the brain show flow. Skull and upper cervical spine: Negative Sinuses/Orbits: Clear/normal Other: None IMPRESSION: No evidence of metastatic disease. Chronic small-vessel ischemic changes of the pons and cerebral hemispheric white matter. No acute insult. Electronically Signed   By: Nelson Chimes M.D.   On: 01/29/2018 19:44   Ir Gastrostomy Tube  Result Date: 01/29/2018 CLINICAL DATA:  Metastatic esophageal carcinoma with high-grade stricture of the distal esophagus and GE junction. Gastrostomy tube needed for additional nutrition. EXAM: PERCUTANEOUS GASTROSTOMY TUBE PLACEMENT ANESTHESIA/SEDATION: 1.0 mg IV Versed; 50 mcg IV Fentanyl. Total Moderate Sedation Time 36 minutes. The patient's level of consciousness and physiologic status were continuously monitored during the procedure by Radiology nursing. CONTRAST:  20 mL Isovue-300 MEDICATIONS: 2 g IV Ancef. IV antibiotic was administered in an appropriate time interval prior to needle puncture of the skin. 0.5 mg IV glucagon was also administered during the procedure. FLUOROSCOPY TIME:  5 minutes and 48 seconds.  76.0 mGy. PROCEDURE: The procedure, risks, benefits, and alternatives were explained to the patient and his significant other who is POA. Questions regarding the procedure were encouraged and answered. Consent was obtained prior to the procedure. A time-out was performed prior to initiating the procedure. A 5-French catheter was then advanced through  the patient's mouth under fluoroscopy into the esophagus and to the level of the stomach. This catheter was used to insufflate the stomach with air under fluoroscopy. The abdominal wall was prepped with chlorhexidine in a sterile fashion, and a sterile drape was applied covering the operative field. A sterile gown and sterile gloves were used for the procedure. Local anesthesia was provided with 1% Lidocaine. A skin incision was made in the upper abdominal wall. Under fluoroscopy, an 18 gauge trocar needle was advanced into the stomach. Contrast injection was performed to confirm intraluminal position of the needle tip. A T tack was then deployed in the lumen of the stomach. This was brought up to tension at the skin surface. A second T tack was then deployed in the stomach utilizing identical procedure spaced approximately 4 cm away from the first T tack. Puncture of the stomach was then performed with a 19 gauge needle in between the T tacks. Contrast injection was performed to confirm intraluminal position. A guidewire was advanced into the gastric lumen. The puncture site was dilated over the wire. An 61 French peel-away sheath was then advanced into the stomach. A 16 French balloon retention gastrostomy tube was advanced through the peel-away sheath. The sheath was removed. The retention balloon of the gastrostomy tube was inflated with 7 mL of saline. The catheter was injected with contrast material to confirm position and a fluoroscopic spot image saved. The tube was then flushed with saline. A dressing was applied over the gastrostomy exit site. COMPLICATIONS: None. FINDINGS: The stomach distended well with air allowing safe placement of the gastrostomy tube. After placement, the tip of the gastrostomy tube lies in the body of the stomach. IMPRESSION: Percutaneous gastrostomy with placement of a 87 French balloon retention tube in the body of the stomach. Electronically Signed   By: Aletta Edouard M.D.    On: 01/29/2018 15:42   Dg Esophagus  Result Date: 01/15/2018 CLINICAL DATA:  Trouble swallowing for 3-4 months. EXAM: ESOPHOGRAM / BARIUM SWALLOW TECHNIQUE: Combined double contrast and single contrast examination performed using effervescent crystals, thick barium liquid, and thin barium liquid. FLUOROSCOPY TIME:  Fluoroscopy Time:  0.9 minute Radiation Exposure Index (if provided by the fluoroscopic device): 3.7 mGy Number of  Acquired Spot Images: 0 COMPARISON:  None. FINDINGS: There was normal pharyngeal anatomy and motility. Contrast flowed freely through the proximal and mid esophagus without evidence of stricture or mass. Distal esophagus just proximal to the gastroesophageal junction demonstrates an irregular stricture measuring 2.5 cm in length. Esophageal motility was normal. No evidence of reflux. No definite hiatal hernia was demonstrated. IMPRESSION: 1. 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 Recommend further evaluation with upper endoscopy. Electronically Signed   By: Kathreen Devoid   On: 01/15/2018 08:34   Nm Pet Image Initial (pi) Skull Base To Thigh  Result Date: 01/20/2018 CLINICAL DATA:  Initial treatment strategy for distal esophageal mass. EXAM: NUCLEAR MEDICINE PET SKULL BASE TO THIGH TECHNIQUE: 6.8 mCi F-18 FDG was injected intravenously. Full-ring PET imaging was performed from the skull base to thigh after the radiotracer. CT data was obtained and used for attenuation correction and anatomic localization. Fasting blood glucose: 88 mg/dl COMPARISON:  Esophagram dated 01/15/2018 FINDINGS: Mediastinal blood pool activity: SUV max 1.9 NECK: No significant abnormal hypermetabolic activity in the neck. Incidental CT findings: none CHEST: In the distal esophagus and extending into the adjacent gastric cardia, a hypermetabolic mass is present with maximum SUV 13.8,  corresponding to the area of irregularity on recent esophagram. No appreciable hypermetabolic intrathoracic adenopathy. 0.7 by 0.8 cm right lower lobe pulmonary nodule on image 99/3 demonstrates central cavitation and has visible but very low-grade activity with maximum SUV of 1.1. A nearby empty portion of the right lower lobe has a maximum SUV of 0.4. Incidental CT findings: Coronary, aortic arch, and branch vessel atherosclerotic vascular disease. ABDOMEN/PELVIS: Multiple hypermetabolic masses are present in the liver. An index lesion in the posterior inferior right hepatic lobe has a maximum standard uptake value of 64.4 and has metabolic activity measuring 4.9 by 4.6 cm. At least 10 other masses are present scattered in the liver. I do not see definite abnormal hypermetabolic activity within the porta hepatis. The patient has marked paucity of intra-adipose tissue as well as the extensive retained very high density barium reduced diagnostic sensitivity and specificity by causing severe streak artifact and high density that disrupts the attenuation correction of the PET-CT. I did deliberately evaluate non attenuation corrected images as well in order to help contract the effect of the dense barium and attenuation correction. No additional hypermetabolic intra-abdominal lesions are observed. Incidental CT findings: Images are severely degraded by the streak artifact from the dense barium. Delayed transit time is implied as the patient's barium administration was 5 days ago, and most of this barium should have cleared by now. Aortoiliac atherosclerotic vascular disease. Vascular calcifications in both renal hila. SKELETON: No findings of hypermetabolic skeletal lesion. Focus of activity in the left antecubital fossa is felt to be injection related and matches the site of injection. Incidental CT findings: Thoracic kyphosis. IMPRESSION: 1. Hypermetabolic mass in the distal esophagus and adjacent stomach, SUV 13.8,  compatible with malignancy. There are over 10 scattered hypermetabolic masses in the liver compatible with widespread hepatic metastatic disease. 2. 8 mm notably cavitary right lower lobe pulmonary nodule is faintly hypermetabolic. Although the maximum SUV is only 1.1, the wall thickness of this lesion is only about 2 mm and accordingly the activity is felt to be abnormal relative to the size of the lesion. This could represent a solitary metastatic lesion to the lung, or a cavitary infectious/inflammatory process. 3.  No definite hypermetabolic adenopathy or other metastatic lesions observed. Please note that negative predictive value is adversely affected by the presence of very dense residual barium in the colon (implying delayed clearance of barium from the bowel, given that this barium was administered 5 days ago) and due to the patient's marked paucity of intra-adipose tissue which complicates separation of adjacent structures. In order to help offset the dense barium, I did carefully reviewed the non-attenuation corrected lesions. 4. Other imaging findings of potential clinical significance: Aortic Atherosclerosis (ICD10-I70.0). Coronary atherosclerosis. Thoracic kyphosis. Electronically Signed   By: Van Clines M.D.   On: 01/20/2018 14:18   US Biopsy (liver)  Result Date: 01/26/2018 CLINICAL DATA:  Esophageal mass with multiple liver lesions suspicious for metastatic disease. The patient presents for ultrasound-guided biopsy of 1 of the liver lesions. EXAM: ULTRASOUND GUIDED CORE BIOPSY OF LIVER MEDICATIONS: 2.0 mg IV Versed; 75 mcg IV Fentanyl Total Moderate Sedation Time: 14 minutes. The patient's level of consciousness and physiologic status were continuously monitored during the procedure by Radiology nursing. PROCEDURE: The procedure, risks, benefits, and alternatives were explained to the patient. Questions regarding the procedure were encouraged and answered. The patient understands and  consents to the procedure. A time out was performed prior to initiating the procedure. The operative field was prepped with chlorhexidine in a sterile fashion, and a sterile drape was applied covering the operative field. A sterile gown and sterile gloves were used for the procedure. Local anesthesia was provided with 1% Lidocaine. Multiple lesions were localized in the liver. A lesion in the right lobe of the liver was chosen for biopsy. Under ultrasound guidance a 17 gauge needle was advanced into the lesion. Three separate coaxial 18 gauge core biopsy samples were obtained and submitted in formalin. After the procedure Gel-Foam pledgets were advanced through the outer needle as the needle was retracted. Post biopsy imaging was performed with ultrasound. COMPLICATIONS: None FINDINGS: A large, hyperechoic lesion within the inferior right lobe of the liver measuring approximately 6 cm in maximum diameter was targeted. Solid tissue was obtained. IMPRESSION: Ultrasound-guided core biopsy performed a roughly 6 cm mass within the inferior right lobe of the liver. Electronically Signed   By: Aletta Edouard M.D.   On: 01/26/2018 13:32    Assessment and plan- Patient is a 72 y.o. male with history of stage IV esophageal cancer who [facility].    1. 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 Patient verbalizes understanding that treatments given with a palliative intent. G tube in place. Provided teaching for use in clinic and patient demonstrates competency. Dietician conducted teaching in clinic and patient returns rationale. Chemo education in clinic today and patient returns precautions, risks/benefits, and rationale.   2. Mood Alterations/Altered Mental Status- Lengthy discussion with patient and family regarding recent mood or memory lapses. Patient alert and oriented today in clinic.  He uses blunted short phrases and frequently responded with head shaking versus language. Per family and Dr. Tasia Catchings this is baseline for patient. No apparent alteration in memory or apparent confusion today. Previous MRI negative for brain mets. Chronic small-vessel ischemic changes in pons and cerebral hemispheric white matter w/o evidence of acute insult were apparent. Ethanol- normal, TSH- 1.692, Ammonia- <9, UDS- normal as expected. Dr. Tasia Catchings to check B12 and folate at next appt to assess for reversible dementia. Etiology unclear but given known metastatic disease, overall concerning for poor prognosis  which was discussed in depth with family and patient. Other differentials discussed: poor coping with new diagnosis/altered independence vs small vessel disease vs dementia.    3. Abnormal UA- UA positive for glucose, protein, and hyaline casts. Chart review reveals ha1c 6.6 on 01/07/2018 meeting diagnostic criteria for T2DM. On lisinopril. Appreciate PCPs continued care and management of this.   4. Goals of Care- patient verbalizes uncertainty in pursuing treatments. He reports concern for how others are coping with his diagnosis. At this time he continues to verbalize his wishes to proceed with treatments.   Discussed his case extensively with Dr. Tasia Catchings. She will see patient in clinic on 02/03/18 for further evaluation. No recommendations for additional imaging at this time. Will also refer to psychiatry for evaluation of capacity.   Visit Diagnosis 1. Altered mental status, unspecified altered mental status type   2. Esophageal cancer, stage IV ([place])   3. Goals of care, counseling/discussion   4. Abnormal urinalysis     Patient expressed understanding and was in agreement with this plan. He also understands that He can call clinic at any time with any questions, concerns, or complaints.   A total of (65) minutes of face-to-face time was spent with this patient with greater than 50% of that time in  counseling and care-coordination.   Beckey Rutter, DNP, AGNP-C [place] [facility] [phone] (work cell) [phone] (office) 02/09/18 4:17 PM

## 2018-02-01 NOTE — Addendum Note (Signed)
Addended by: Jennet Maduro B on: 02/01/2018 02:33 PM   Modules accepted: Orders

## 2018-02-01 NOTE — Progress Notes (Addendum)
Nutrition Assessment   Reason for Assessment:   Patient with esophageal mass and new G-tube  ASSESSMENT:  72 year old male with new diagnosis of metastatic esophageal cancer.   Planning chemotherapy and radiation treatment.  Past medical history of HLD, HTN.  Patient s/p 16 frend intuit gastrostomy tube placed on 4/5.    Met with patient, significant other, daughter and son-in-law in clinic.  Patient taking mostly boost shakes, ice cream, crackers and potato chips until dissolved in mouth orally.  Did eat crispy bojangles biscuit (1/2 only).  Does not really like soups per significant other. Significant other reports that at times he has been drinking up to 6 cartons of boost shakes per day. Patient quiet during much of visit. Significant other answered most of questions.   Reports constipation, last bowel movement day before yesterday.    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  Patient shook head no several times during meeting.  RD asked patient if this is something he wanted to do and his response was "I guess."   Family wanted to try bolus feeding first.  Showed patient sample feeding tube and patient  was able to return demonstration of bolus administration using water in sample tube with assistance. Recommend osmolite 1.5, 5 cartons per day to meet nutritional needs. Patient will need to flush tube with water 63m before and after giving bolus tube feeding (5 times per day) This will provide 1775 calories, 74.5 g protein and 15016mwater (with free water flush).  Discussed with patient that he will still be able to drink orally as well.   Discussed option of drinking boost plus shakes (2 per day) and taking osmolite 1.5 (3 per day via G-tube) to meet nutritional needs. Will start with 1/2 carton at each feeding (5 per day) and increase by 1/2 carton daily until goal of 5 cartons of osmolite 1.5 per day.  Sample formula, syringes, dressing supplies provided as well.   PEG care reviewed by LaAnder PurpuraNP and patient able to return demonstration.  Patient could benefit from home health nursing to reinforce tube feeding and dressing changes.   Hand written copies of PEG care instructions, tube feeding regimen and how to perform bolus tube feeding provided.   RD to contact AdDry Ridgeegarding tube feeding orders    MONITORING, EVALUATION, GOAL: weight trends, intake, TF   NEXT VISIT: April 15 after radiation.  Significant other requests Monday appointment as sister will be with patient.  Contact information provided  Sharlet Notaro B. AlZenia ResidesRDMorseLDCapronegistered Dietitian [phone])

## 2018-02-02 ENCOUNTER — Encounter
Admission: RE | Disposition: A | Discharge status: Home / Self Care | Payer: Self-pay | Source: Ambulatory Visit | Attending: Vascular Surgery

## 2018-02-02 ENCOUNTER — Ambulatory Visit: Payer: Medicare HMO

## 2018-02-02 ENCOUNTER — Ambulatory Visit
Admission: RE | Admit: 2018-02-02 | Discharge: 2018-02-02 | Disposition: A | Discharge status: Home / Self Care | Payer: Medicare HMO | Source: Ambulatory Visit | Attending: Vascular Surgery | Admitting: Vascular Surgery

## 2018-02-02 ENCOUNTER — Telehealth: Payer: Self-pay | Admitting: *Deleted

## 2018-02-02 DIAGNOSIS — E785 Hyperlipidemia, unspecified: Secondary | ICD-10-CM | POA: Insufficient documentation

## 2018-02-02 DIAGNOSIS — G893 Neoplasm related pain (acute) (chronic): Secondary | ICD-10-CM

## 2018-02-02 DIAGNOSIS — E43 Unspecified severe protein-calorie malnutrition: Secondary | ICD-10-CM | POA: Diagnosis not present

## 2018-02-02 DIAGNOSIS — I10 Essential (primary) hypertension: Secondary | ICD-10-CM | POA: Insufficient documentation

## 2018-02-02 DIAGNOSIS — Z9889 Other specified postprocedural states: Secondary | ICD-10-CM | POA: Insufficient documentation

## 2018-02-02 DIAGNOSIS — C159 Malignant neoplasm of esophagus, unspecified: Secondary | ICD-10-CM

## 2018-02-02 DIAGNOSIS — Z7982 Long term (current) use of aspirin: Secondary | ICD-10-CM | POA: Diagnosis not present

## 2018-02-02 DIAGNOSIS — Z79899 Other long term (current) drug therapy: Secondary | ICD-10-CM | POA: Insufficient documentation

## 2018-02-02 DIAGNOSIS — R413 Other amnesia: Secondary | ICD-10-CM | POA: Diagnosis not present

## 2018-02-02 DIAGNOSIS — Z66 Do not resuscitate: Secondary | ICD-10-CM

## 2018-02-02 DIAGNOSIS — C787 Secondary malignant neoplasm of liver and intrahepatic bile duct: Secondary | ICD-10-CM

## 2018-02-02 DIAGNOSIS — F17210 Nicotine dependence, cigarettes, uncomplicated: Secondary | ICD-10-CM | POA: Diagnosis not present

## 2018-02-02 DIAGNOSIS — Z515 Encounter for palliative care: Secondary | ICD-10-CM

## 2018-02-02 HISTORY — PX: PORTA CATH INSERTION: CATH118285

## 2018-02-02 SURGERY — PORTA CATH INSERTION
Anesthesia: Moderate Sedation

## 2018-02-02 MED ORDER — SODIUM CHLORIDE 0.9 % IV SOLN
INTRAVENOUS | Status: DC
  Administered 2018-02-02: 11:00:00 via INTRAVENOUS

## 2018-02-02 MED ORDER — ONDANSETRON HCL 4 MG/2ML IJ SOLN: 4.0000 mg | Freq: Four times a day (QID) | INTRAMUSCULAR | Status: DC | PRN

## 2018-02-02 MED ORDER — MIDAZOLAM HCL 2 MG/2ML IJ SOLN
INTRAMUSCULAR | Status: DC | PRN
  Administered 2018-02-02: 2 mg via INTRAVENOUS

## 2018-02-02 MED ORDER — HYDROMORPHONE HCL 1 MG/ML IJ SOLN: 1.0000 mg | Freq: Once | INTRAMUSCULAR | Status: DC | PRN

## 2018-02-02 MED ORDER — CEFAZOLIN SODIUM-DEXTROSE 2-4 GM/100ML-% IV SOLN
2.0000 g | Freq: Once | INTRAVENOUS | Status: AC
  Administered 2018-02-02: 2 g via INTRAVENOUS

## 2018-02-02 MED ORDER — HEPARIN (PORCINE) IN NACL 2-0.9 UNIT/ML-% IJ SOLN
INTRAMUSCULAR | Status: AC
  Filled 2018-02-02: qty 500

## 2018-02-02 MED ORDER — FENTANYL CITRATE (PF) 100 MCG/2ML IJ SOLN
INTRAMUSCULAR | Status: DC | PRN
  Administered 2018-02-02: 50 ug via INTRAVENOUS

## 2018-02-02 MED ORDER — MIDAZOLAM HCL 5 MG/5ML IJ SOLN
INTRAMUSCULAR | Status: AC
  Filled 2018-02-02: qty 5

## 2018-02-02 MED ORDER — FENTANYL CITRATE (PF) 100 MCG/2ML IJ SOLN
INTRAMUSCULAR | Status: AC
  Filled 2018-02-02: qty 2

## 2018-02-02 MED ORDER — SODIUM CHLORIDE 0.9 % IV SOLN: Freq: Once | INTRAVENOUS | Status: DC

## 2018-02-02 MED ORDER — LIDOCAINE-EPINEPHRINE (PF) 1 %-1:200000 IJ SOLN
INTRAMUSCULAR | Status: AC
  Filled 2018-02-02: qty 30

## 2018-02-02 SURGICAL SUPPLY — 10 items
DERMABOND ADVANCED (GAUZE/BANDAGES/DRESSINGS) ×2
DERMABOND ADVANCED .7 DNX12 (GAUZE/BANDAGES/DRESSINGS) ×1 IMPLANT
DRAPE INCISE IOBAN 66X45 STRL (DRAPES) ×6 IMPLANT
GAUZE SPONGE 4X4 12PLY STRL (GAUZE/BANDAGES/DRESSINGS) ×3 IMPLANT
KIT PORT POWER 8FR ISP CVUE (Port) ×3 IMPLANT
NEEDLE ENTRY 21GA 7CM ECHOTIP (NEEDLE) ×3 IMPLANT
PACK ANGIOGRAPHY (CUSTOM PROCEDURE TRAY) ×3 IMPLANT
SET INTRO CAPELLA COAXIAL (SET/KITS/TRAYS/PACK) ×3 IMPLANT
SUT MNCRL AB 4-0 PS2 18 (SUTURE) ×3 IMPLANT
SUTURE VIC 3-0 (SUTURE) ×3 IMPLANT

## 2018-02-02 NOTE — Telephone Encounter (Signed)
Zofran 8mg  tablet approved until 10/26/18. Emla cream approved for 3 months.

## 2018-02-02 NOTE — Telephone Encounter (Signed)
Received prior authorization request from [facility] on Zofran 8mg  tablet and Emla Cream.    Prior Authorization completed via CoverMyMeds and by verbal request via phone.

## 2018-02-02 NOTE — Op Note (Signed)
OPERATIVE NOTE   PROCEDURE: 1. Placement of a right IJ Infuse-a-Port  PRE-OPERATIVE DIAGNOSIS: Esophageal carcinoma  POST-OPERATIVE DIAGNOSIS: Same  SURGEON: Katha Cabal M.D.  ANESTHESIA: Conscious sedation was administered under my direct supervision by the interventional radiology RN. IV Versed plus fentanyl were utilized. Continuous ECG, pulse oximetry and blood pressure was monitored throughout the entire procedure. Conscious sedation was for a total of 32 minutes.  ESTIMATED BLOOD LOSS: Minimal   FINDING(S): 1.  Patent vein  SPECIMEN(S): None  INDICATIONS:   Evan Mason is a 72 y.o. male who presents with esophageal carcinoma he is now undergoing chemotherapy and requires appropriate IV access.  Risks and benefits were reviewed patient agrees to proceed..  DESCRIPTION: After obtaining full informed written consent, the patient was brought back to the special procedure suite and placed in the supine position. The patient's right neck and chest wall are prepped and draped in sterile fashion. Appropriate timeout was called.  Ultrasound is placed in a sterile sleeve, ultrasound is utilized to avoid vascular injury as well as secondary to lack of appropriate landmarks. The right internal jugular vein is identified. It is echolucent and homogeneous as well as easily compressible indicating patency. An image is recorded for the permanent record.  Access to the vein with a micropuncture needle is done under direct ultrasound visualization.  1% lidocaine is infiltrated into the soft tissue at the base of the neck as well as on the chest wall.  Under direct ultrasound visualization a micro-needle is inserted into the vein followed by the micro-wire. Micro-sheath was then advanced and a J wire is inserted without difficulty under fluoroscopic guidance. A small counterincision was created at the wire insertion site. A transverse incision is created 2 fingerbreadths below the clavicle  and a pocket is fashioned using both blunt and sharp dissection. The pocket is tested for appropriate size with the hub of the Infuse-a-Port. The tunneling device is then used to pull the intravascular portion of the catheter from the pocket to the neck counterincision.  Dilator and peel-away sheath were then inserted over the wire and the wire is removed. Catheter is then advanced into the venous system without difficulty. Peel-away sheath was then removed.  Catheter is then positioned under fluoroscopic guidance at the atrial caval junction. It is then transected connected to the hub and the hope is slipped into the subcutaneous pocket on the chest wall. The hub was then accessed percutaneously and aspirates easily and flushes well and is flushed with 30 cc of heparinized saline. The pocket incision is then closed in layers using interrupted 3-0 Vicryl for the subcutaneous tissues and 4-0 Monocryl subcuticular for skin closure. Dermabond is applied. The neck counterincision was closed with 4-0 Monocryl subcuticular and Dermabond as well.  The patient tolerated the procedure well and there were no immediate complications.  COMPLICATIONS: None  CONDITION: Unchanged  Katha Cabal M.D. [facility] Office: [phone]   02/02/2018, 1:03 PM

## 2018-02-02 NOTE — H&P (Signed)
[facility] History & Physical Update  The patient was interviewed and re-examined.  The patient's previous History and Physical has been reviewed and is unchanged.  There is no change in the plan of care. We plan to proceed with the scheduled procedure.  Hortencia Pilar, MD  02/02/2018, 1:00 PM

## 2018-02-02 NOTE — Discharge Instructions (Signed)
Moderate Conscious Sedation, Adult, Care After °These instructions provide you with information about caring for yourself after your procedure. Your health care provider may also give you more specific instructions. Your treatment has been planned according to current medical practices, but problems sometimes occur. Call your health care provider if you have any problems or questions after your procedure. °What can I expect after the procedure? °After your procedure, it is common: °· To feel sleepy for several hours. °· To feel clumsy and have poor balance for several hours. °· To have poor judgment for several hours. °· To vomit if you eat too soon. ° °Follow these instructions at home: °For at least 24 hours after the procedure: ° °· Do not: °? Participate in activities where you could fall or become injured. °? Drive. °? Use heavy machinery. °? Drink alcohol. °? Take sleeping pills or medicines that cause drowsiness. °? Make important decisions or sign legal documents. °? Take care of children on your own. °· Rest. °Eating and drinking °· Follow the diet recommended by your health care provider. °· If you vomit: °? Drink water, juice, or soup when you can drink without vomiting. °? Make sure you have little or no nausea before eating solid foods. °General instructions °· Have a responsible adult stay with you until you are awake and alert. °· Take over-the-counter and prescription medicines only as told by your health care provider. °· If you smoke, do not smoke without supervision. °· Keep all follow-up visits as told by your health care provider. This is important. °Contact a health care provider if: °· You keep feeling nauseous or you keep vomiting. °· You feel light-headed. °· You develop a rash. °· You have a fever. °Get help right away if: °· You have trouble breathing. °This information is not intended to replace advice given to you by your health care provider. Make sure you discuss any questions you have  with your health care provider. °Document Released: 08/03/2013 Document Revised: 03/17/2016 Document Reviewed: 02/02/2016 °Elsevier Interactive Patient Education © 2018 [employer] °Implanted Port Insertion, Care After °This sheet gives you information about how to care for yourself after your procedure. Your health care provider may also give you more specific instructions. If you have problems or questions, contact your health care provider. °What can I expect after the procedure? °After your procedure, it is common to have: °· Discomfort at the port insertion site. °· Bruising on the skin over the port. This should improve over 3-4 days. ° °Follow these instructions at home: °Port care °· After your port is placed, you will get a manufacturer's information card. The card has information about your port. Keep this card with you at all times. °· Take care of the port as told by your health care provider. Ask your health care provider if you or a family member can get training for taking care of the port at home. A home health care nurse may also take care of the port. °· Make sure to remember what type of port you have. °Incision care °· Follow instructions from your health care provider about how to take care of your port insertion site. Make sure you: °? Wash your hands with soap and water before you change your bandage (dressing). If soap and water are not available, use hand sanitizer. °? Change your dressing as told by your health care provider. °? Leave stitches (sutures), skin glue, or adhesive strips in place. These skin closures may need to stay   in place for 2 weeks or longer. If adhesive strip edges start to loosen and curl up, you may trim the loose edges. Do not remove adhesive strips completely unless your health care provider tells you to do that. °· Check your port insertion site every day for signs of infection. Check for: °? More redness, swelling, or pain. °? More fluid or  blood. °? Warmth. °? Pus or a bad smell. °General instructions °· Do not take baths, swim, or use a hot tub until your health care provider approves. °· Do not lift anything that is heavier than 10 lb (4.5 kg) for a week, or as told by your health care provider. °· Ask your health care provider when it is okay to: °? Return to work or school. °? Resume usual physical activities or sports. °· Do not drive for 24 hours if you were given a medicine to help you relax (sedative). °· Take over-the-counter and prescription medicines only as told by your health care provider. °· Wear a medical alert bracelet in case of an emergency. This will tell any health care providers that you have a port. °· Keep all follow-up visits as told by your health care provider. This is important. °Contact a health care provider if: °· You cannot flush your port with saline as directed, or you cannot draw blood from the port. °· You have a fever or chills. °· You have more redness, swelling, or pain around your port insertion site. °· You have more fluid or blood coming from your port insertion site. °· Your port insertion site feels warm to the touch. °· You have pus or a bad smell coming from the port insertion site. °Get help right away if: °· You have chest pain or shortness of breath. °· You have bleeding from your port that you cannot control. °Summary °· Take care of the port as told by your health care provider. °· Change your dressing as told by your health care provider. °· Keep all follow-up visits as told by your health care provider. °This information is not intended to replace advice given to you by your health care provider. Make sure you discuss any questions you have with your health care provider. °Document Released: 08/03/2013 Document Revised: 09/03/2016 Document Reviewed: 09/03/2016 °Elsevier Interactive Patient Education © 2017 [employer] Inc. ° °

## 2018-02-03 ENCOUNTER — Inpatient Hospital Stay (HOSPITAL_BASED_OUTPATIENT_CLINIC_OR_DEPARTMENT_OTHER): Payer: Medicare HMO | Admitting: Oncology

## 2018-02-03 ENCOUNTER — Other Ambulatory Visit: Payer: Self-pay

## 2018-02-03 ENCOUNTER — Inpatient Hospital Stay: Payer: Medicare HMO

## 2018-02-03 ENCOUNTER — Encounter: Payer: Self-pay | Admitting: Vascular Surgery

## 2018-02-03 VITALS — BP 153/69 | HR 97 | Temp 97.5°F | Wt 124.5 lb

## 2018-02-03 DIAGNOSIS — C155 Malignant neoplasm of lower third of esophagus: Secondary | ICD-10-CM | POA: Diagnosis not present

## 2018-02-03 DIAGNOSIS — R5381 Other malaise: Secondary | ICD-10-CM

## 2018-02-03 DIAGNOSIS — E119 Type 2 diabetes mellitus without complications: Secondary | ICD-10-CM | POA: Diagnosis not present

## 2018-02-03 DIAGNOSIS — C159 Malignant neoplasm of esophagus, unspecified: Secondary | ICD-10-CM

## 2018-02-03 DIAGNOSIS — Z79899 Other long term (current) drug therapy: Secondary | ICD-10-CM

## 2018-02-03 DIAGNOSIS — R634 Abnormal weight loss: Secondary | ICD-10-CM

## 2018-02-03 DIAGNOSIS — Z5111 Encounter for antineoplastic chemotherapy: Secondary | ICD-10-CM

## 2018-02-03 DIAGNOSIS — F17210 Nicotine dependence, cigarettes, uncomplicated: Secondary | ICD-10-CM

## 2018-02-03 DIAGNOSIS — I10 Essential (primary) hypertension: Secondary | ICD-10-CM

## 2018-02-03 DIAGNOSIS — C787 Secondary malignant neoplasm of liver and intrahepatic bile duct: Secondary | ICD-10-CM

## 2018-02-03 DIAGNOSIS — R410 Disorientation, unspecified: Secondary | ICD-10-CM

## 2018-02-03 DIAGNOSIS — Z931 Gastrostomy status: Secondary | ICD-10-CM | POA: Diagnosis not present

## 2018-02-03 DIAGNOSIS — E43 Unspecified severe protein-calorie malnutrition: Secondary | ICD-10-CM | POA: Diagnosis not present

## 2018-02-03 DIAGNOSIS — R918 Other nonspecific abnormal finding of lung field: Secondary | ICD-10-CM | POA: Diagnosis not present

## 2018-02-03 DIAGNOSIS — R4182 Altered mental status, unspecified: Secondary | ICD-10-CM

## 2018-02-03 DIAGNOSIS — I2510 Atherosclerotic heart disease of native coronary artery without angina pectoris: Secondary | ICD-10-CM

## 2018-02-03 DIAGNOSIS — R5383 Other fatigue: Secondary | ICD-10-CM

## 2018-02-03 DIAGNOSIS — F99 Mental disorder, not otherwise specified: Secondary | ICD-10-CM

## 2018-02-03 DIAGNOSIS — R1310 Dysphagia, unspecified: Secondary | ICD-10-CM

## 2018-02-03 DIAGNOSIS — R070 Pain in throat: Secondary | ICD-10-CM

## 2018-02-03 DIAGNOSIS — I700 Atherosclerosis of aorta: Secondary | ICD-10-CM

## 2018-02-03 DIAGNOSIS — Z7982 Long term (current) use of aspirin: Secondary | ICD-10-CM

## 2018-02-03 DIAGNOSIS — E785 Hyperlipidemia, unspecified: Secondary | ICD-10-CM

## 2018-02-03 DIAGNOSIS — Z85828 Personal history of other malignant neoplasm of skin: Secondary | ICD-10-CM

## 2018-02-03 DIAGNOSIS — K228 Other specified diseases of esophagus: Secondary | ICD-10-CM

## 2018-02-03 DIAGNOSIS — Z7189 Other specified counseling: Secondary | ICD-10-CM

## 2018-02-03 DIAGNOSIS — K2289 Other specified disease of esophagus: Secondary | ICD-10-CM

## 2018-02-03 LAB — COMPREHENSIVE METABOLIC PANEL
ALBUMIN: 3.7 g/dL (ref 3.5–5.0)
ALK PHOS: 191 U/L — AB (ref 38–126)
ALT: 26 U/L (ref 17–63)
ANION GAP: 8 (ref 5–15)
AST: 36 U/L (ref 15–41)
BUN: 26 mg/dL — AB (ref 6–20)
CO2: 25 mmol/L (ref 22–32)
Calcium: 9.2 mg/dL (ref 8.9–10.3)
Chloride: 100 mmol/L — ABNORMAL LOW (ref 101–111)
Creatinine, Ser: 0.73 mg/dL (ref 0.61–1.24)
GFR calc Af Amer: >60 mL/min (ref >60)
GFR calc non Af Amer: >60 mL/min (ref >60)
GLUCOSE: 108 mg/dL — AB (ref 65–99)
POTASSIUM: 4.3 mmol/L (ref 3.5–5.1)
Sodium: 133 mmol/L — ABNORMAL LOW (ref 135–145)
Total Bilirubin: 0.7 mg/dL (ref 0.3–1.2)
Total Protein: 6.4 g/dL — ABNORMAL LOW (ref 6.5–8.1)

## 2018-02-03 LAB — CBC WITH DIFFERENTIAL/PLATELET
Basophils Absolute: 0.1 10*3/uL (ref 0–0.1)
Basophils Relative: 1 %
EOS PCT: 0 %
Eosinophils Absolute: 0 10*3/uL (ref 0–0.7)
HEMATOCRIT: 36.8 % — AB (ref 40.0–52.0)
Hemoglobin: 12.6 g/dL — ABNORMAL LOW (ref 13.0–18.0)
LYMPHS ABS: 0.7 10*3/uL — AB (ref 1.0–3.6)
Lymphocytes Relative: 9 %
MCH: 30.9 pg (ref 26.0–34.0)
MCHC: 34.1 g/dL (ref 32.0–36.0)
MCV: 90.5 fL (ref 80.0–100.0)
MONO ABS: 0.5 10*3/uL (ref 0.2–1.0)
MONOS PCT: 7 %
NEUTROS ABS: 6.6 10*3/uL — AB (ref 1.4–6.5)
Neutrophils Relative %: 83 %
Platelets: 309 10*3/uL (ref 150–440)
RBC: 4.07 MIL/uL — ABNORMAL LOW (ref 4.40–5.90)
RDW: 14.5 % (ref 11.5–14.5)
WBC: 7.9 10*3/uL (ref 3.8–10.6)

## 2018-02-03 LAB — MAGNESIUM: MAGNESIUM: 2 mg/dL (ref 1.7–2.4)

## 2018-02-03 LAB — FOLATE: Folate: 18.3 ng/mL (ref >5.9)

## 2018-02-03 LAB — VITAMIN B12: VITAMIN B 12: 4458 pg/mL — AB (ref 180–914)

## 2018-02-03 MED ORDER — DEXTROSE 5 % IV SOLN
Freq: Once | INTRAVENOUS | Status: AC
  Administered 2018-02-03: 13:00:00 via INTRAVENOUS
  Filled 2018-02-03: qty 1000

## 2018-02-03 MED ORDER — SODIUM CHLORIDE 0.9 % IV SOLN
2400.0000 mg/m2 | INTRAVENOUS | Status: DC
  Administered 2018-02-03: 3900 mg via INTRAVENOUS
  Filled 2018-02-03: qty 78

## 2018-02-03 MED ORDER — SODIUM CHLORIDE 0.9 % IV SOLN: 10.0000 mg | Freq: Once | INTRAVENOUS | Status: DC

## 2018-02-03 MED ORDER — FLUOROURACIL CHEMO INJECTION 2.5 GM/50ML
400.0000 mg/m2 | Freq: Once | INTRAVENOUS | Status: AC
  Administered 2018-02-03: 650 mg via INTRAVENOUS
  Filled 2018-02-03: qty 13

## 2018-02-03 MED ORDER — OXALIPLATIN CHEMO INJECTION 100 MG/20ML
150.0000 mg | Freq: Once | INTRAVENOUS | Status: AC
  Administered 2018-02-03: 150 mg via INTRAVENOUS
  Filled 2018-02-03: qty 20

## 2018-02-03 MED ORDER — PALONOSETRON HCL INJECTION 0.25 MG/5ML
0.2500 mg | Freq: Once | INTRAVENOUS | Status: AC
  Administered 2018-02-03: 0.25 mg via INTRAVENOUS
  Filled 2018-02-03: qty 5

## 2018-02-03 MED ORDER — LEUCOVORIN CALCIUM INJECTION 350 MG
650.0000 mg | Freq: Once | INTRAVENOUS | Status: AC
  Administered 2018-02-03: 650 mg via INTRAVENOUS
  Filled 2018-02-03: qty 25

## 2018-02-03 MED ORDER — DEXAMETHASONE SODIUM PHOSPHATE 10 MG/ML IJ SOLN
10.0000 mg | Freq: Once | INTRAMUSCULAR | Status: AC
  Administered 2018-02-03: 10 mg via INTRAVENOUS
  Filled 2018-02-03: qty 1

## 2018-02-03 NOTE — Progress Notes (Signed)
Patient here today for follow up.  Patient wasn't cooperative when asking assessment questions, his answer was "all of the above".  I asked when was the last time he vomited, he answered with I dont know. I asked when the last time he had diarrhea or constipation and he said I dont know.

## 2018-02-03 NOTE — Progress Notes (Signed)
Hematology/Oncology Consult note [facility] [phone] [phone]   Patient Care Team: Kirk Ruths, MD as PCP - General (Internal Medicine) Clent Jacks, RN as Registered Nurse  REFERRING PROVIDER: Kirk Ruths, MD  Lollie Sails MD   REASON FOR VISIT Follow up for treatment of metastatic esophageal cancer.  HISTORY OF PRESENTING ILLNESS:  Evan Mason is a  72 y.o.  male with PMH listed below who was referred to me for evaluation of Esophageal mass. Patient recently presented emergency room of swallowing 40 pound weight loss. 01/19/2018 upper endoscopy showedFungating/infiltrating mass at the GE junction, near complete obstructing. Biopsies taken. Biopsy showed at least high-grade dysplasia, cannot exclude adenocarcinoma in this very small sample. Patient has had a PET scan done which showed hypermetabolic mass in the distal esophagus and adjacent stomach, SUV 13.8, patible with malignancy.  There are over 10 scattered hypermetabolic masses in the liver compatible with widespread hepatic metastatic disease.  There is also 8 mm multiple cavitary right lower lobe pulmonary nodule is faintly hypermetabolic.   INTERVAL HISTORY Evan Mason is a 72 y.o. male who has above history reviewed by me today presents for follow up visit for management of metastatic esophageal cancer.  Accompanied by one of his daughters, During the interval, he has had gastrostomy tube insertion, medi port insertion.  Was seen by Dietitian and has been started on tube feeding. Tolerates feeding well, no nausea, vomiting, abdominal pain.   No nausea or vomiting.  Family members report that patient has been more forgetful/ confused for the past 2 months. He told RN that he has burning sensation during urination. He was asked if he can give a urine sample, he says no. When I asks him about urinary problem, he denies.     MEDICAL  HISTORY:  Past Medical History:  Diagnosis Date  . Basal cell carcinoma   . Esophageal mass 01/20/2018   From PET scan order  . Hyperlipidemia   . Hypertension     SURGICAL HISTORY: Past Surgical History:  Procedure Laterality Date  . COLONOSCOPY WITH PROPOFOL N/A 12/27/2015   Procedure: COLONOSCOPY WITH PROPOFOL;  Surgeon: Manya Silvas, MD;  Location: [facility] ENDOSCOPY;  Service: Endoscopy;  Laterality: N/A;  . ESOPHAGOGASTRODUODENOSCOPY (EGD) WITH PROPOFOL N/A 01/19/2018   Procedure: ESOPHAGOGASTRODUODENOSCOPY (EGD) WITH PROPOFOL;  Surgeon: Lollie Sails, MD;  Location: [facility] ENDOSCOPY;  Service: Endoscopy;  Laterality: N/A;  . IR GASTROSTOMY TUBE MOD SED  01/29/2018  . MOHS SURGERY    . PORTA CATH INSERTION N/A 02/02/2018   Procedure: PORTA CATH INSERTION;  Surgeon: Katha Cabal, MD;  Location: [place] CV LAB;  Service: Cardiovascular;  Laterality: N/A;    SOCIAL HISTORY: Social History   Socioeconomic History  . Marital status: Divorced    Spouse name: Not on file  . Number of children: Not on file  . Years of education: Not on file  . Highest education level: Not on file  Occupational History  . Not on file  Social Needs  . Financial resource strain: Not on file  . Food insecurity:    Worry: Not on file    Inability: Not on file  . Transportation needs:    Medical: Not on file    Non-medical: Not on file  Tobacco Use  . Smoking status: Current Every Day Smoker    Packs/day: 0.25    Types: Cigarettes  . Smokeless tobacco: Never Used  Substance and Sexual Activity  .  Alcohol use: No  . Drug use: No  . Sexual activity: Not on file  Lifestyle  . Physical activity:    Days per week: Not on file    Minutes per session: Not on file  . Stress: Not on file  Relationships  . Social connections:    Talks on phone: Not on file    Gets together: Not on file    Attends religious service: Not on file    Active member of club or organization: Not on file     Attends meetings of clubs or organizations: Not on file    Relationship status: Not on file  . Intimate partner violence:    Fear of current or ex partner: Not on file    Emotionally abused: Not on file    Physically abused: Not on file    Forced sexual activity: Not on file  Other Topics Concern  . Not on file  Social History Narrative  . Not on file    FAMILY HISTORY: History reviewed. No pertinent family history.  ALLERGIES:  has No Known Allergies.  MEDICATIONS:  Current Outpatient Medications  Medication Sig Dispense Refill  . amLODipine (NORVASC) 10 MG tablet Take 10 mg by mouth daily.    Marland Kitchen aspirin EC 81 MG tablet Take 81 mg by mouth daily.    Marland Kitchen dexlansoprazole (DEXILANT) 60 MG capsule Take 60 mg by mouth daily.    . fentaNYL (DURAGESIC - DOSED MCG/HR) 25 MCG/HR patch Place 1 patch (25 mcg total) onto the skin every 3 (three) days. 5 patch 0  . hydrochlorothiazide (HYDRODIURIL) 25 MG tablet Take 25 mg by mouth daily.    Marland Kitchen lidocaine-prilocaine (EMLA) cream Apply to affected area once 30 g 3  . lisinopril (PRINIVIL,ZESTRIL) 40 MG tablet Take 40 mg by mouth daily.    . Multiple Vitamin (MULTIVITAMIN WITH MINERALS) TABS tablet Take 1 tablet by mouth daily.    . Nutritional Supplements (FEEDING SUPPLEMENT, OSMOLITE 1.5 CAL,) LIQD Give 1 carton of osmolite 1.5, 5 times per day via feeding tube.  Flush with 77m of water before and after feeding osmolite.  Start with 1/2 carton of tube feeding 5 times per day.  Add 1/2 carton of tube feeding daily until goal rate reached. 1185 mL 0  . Omega-3 Fatty Acids (FISH OIL PO) Take 1 capsule by mouth daily.    . ondansetron (ZOFRAN) 8 MG tablet Take 1 tablet (8 mg total) by mouth 2 (two) times daily as needed for refractory nausea / vomiting. Start on day 3 after chemotherapy. 30 tablet 1  . prochlorperazine (COMPAZINE) 10 MG tablet Take 1 tablet (10 mg total) by mouth every 6 (six) hours as needed (Nausea or vomiting). 30 tablet 1  .  sildenafil (REVATIO) 20 MG tablet Take 40-100 mg by mouth daily as needed (erectile dysfunction).      No current facility-administered medications for this visit.      PHYSICAL EXAMINATION: ECOG PERFORMANCE STATUS: 1 - Symptomatic but completely ambulatory Vitals:   02/03/18 1112  BP: (!) 153/69  Pulse: 97  Temp: (!) 97.5 F (36.4 C)   Filed Weights   02/03/18 1112  Weight: 124 lb 8 oz (56.5 kg)    Physical Exam  Constitutional: He is oriented to person, place, and time and well-developed, well-nourished, and in no distress. No distress.  HENT:  Head: Normocephalic and atraumatic.  Mouth/Throat: Oropharynx is clear and moist. No oropharyngeal exudate.  Eyes: EOM are normal.  Neck: Normal range  of motion. Neck supple. No thyromegaly present.  Cardiovascular: Normal rate and regular rhythm.  No murmur heard. Pulmonary/Chest: Effort normal and breath sounds normal. He has no wheezes.  Abdominal: Soft. Bowel sounds are normal. He exhibits no distension. There is no rebound.  gastrostomy tube (+)  Musculoskeletal: Normal range of motion. He exhibits no edema.  Lymphadenopathy:    He has no cervical adenopathy.  Neurological: He is alert and oriented to person, place, and time. No cranial nerve deficit.  Skin: Skin is warm and dry.  Psychiatric:  Oriented x 3. Flat effect.      LABORATORY DATA:  I have reviewed the data as listed Lab Results  Component Value Date   WBC 7.9 02/03/2018   HGB 12.6 (L) 02/03/2018   HCT 36.8 (L) 02/03/2018   MCV 90.5 02/03/2018   PLT 309 02/03/2018   Recent Labs    01/29/18 1039 02/01/18 1005 02/03/18 1019  NA 136 133* 133*  K 4.2 4.4 4.3  CL 102 98* 100*  CO2 25 27 25   GLUCOSE 98 147* 108*  BUN 27* 26* 26*  CREATININE 0.79 0.77 0.73  CALCIUM 9.0 9.1 9.2  GFRNONAA >60 >60 >60  GFRAA >60 >60 >60  PROT 6.1* 6.3* 6.4*  ALBUMIN 3.6 3.7 3.7  AST 37 35 36  ALT 31 28 26   ALKPHOS 181* 191* 191*  BILITOT 0.5 0.5 0.7      RADIOGRAPHIC STUDIES: I have personally reviewed the radiological images as listed and agreed with the findings in the report. PET scan 01/20/2018 1. Hypermetabolic mass in the distal esophagus and adjacent stomach, SUV 13.8, compatible with malignancy. There are over 10 scattered hypermetabolic masses in the liver compatible with widespread hepatic metastatic disease. 2. 8 mm notably cavitary right lower lobe pulmonary nodule is faintly hypermetabolic. Although the maximum SUV is only 1.1, the wall thickness of this lesion is only about 2 mm and accordingly the activity is felt to be abnormal relative to the size of the lesion. This could represent a solitary metastatic lesion to the lung, or a cavitary infectious/inflammatory process. 3. No definite hypermetabolic adenopathy or other metastatic lesions observed. Please note that negative predictive value is adversely affected by the presence of very dense residual barium in the colon (implying delayed clearance of barium from the bowel, given that this barium was administered 5 days ago) and due to the patient's marked paucity of intra-adipose tissue which complicates separation of adjacent structures. In order to help offset the dense barium, I did carefully reviewed the non-attenuation corrected lesions. 4. Other imaging findings of potential clinical significance: Aortic Atherosclerosis (ICD10-I70.0). Coronary atherosclerosis. Thoracic kyphosis.  ASSESSMENT & PLAN:  1. Encounter for antineoplastic chemotherapy   2. Gastrostomy tube in place [facility])   3. Esophageal cancer, stage IV ([place])   4. Goals of care, counseling/discussion   5. Liver metastasis ([place])   6. Severe protein-calorie malnutrition ([place])    Discussed with patient again about rationale and risk of chemotherapy. He voices understanding and is willing to proceed with treatment.  He understands that goal of care is palliative   #  Antiemetics-Zofran and  Compazine; EMLA cream sent to pharmacy  # Follow up with radiation oncologist for palliative radiation. #Esophageal obstruction/severe malnutrition/weight loss: s/p gastrostomy tube insertion. Continue tube feeding. Dietitian following.  Watch for re feeding syndrome   # Mental status: He is oriented x3, : Obtain MRI of brain with and without contrast to further evaluate any CNS involvement.  Check  ammonia level.  We will also check folate and B12 level to rule out any reversible etiology of dementia.  #Pain: Continue fentanyl patch 25 mcg/h every 72 hours.  # Sent Omniseq assay and HER-2 staining awaiting results.  # Referred to psychiatry for further evaluation.   All questions were answered. The patient knows to call the clinic with any problems questions or concerns.  Return of visit: 1 week for assessment for toxicity. 2 weeks for assessment prior to    Earlie Server, MD, PhD Hematology Oncology [facility] Pager- [phone] 02/03/2018

## 2018-02-03 NOTE — Progress Notes (Signed)
Pt had no interest in hearing about chemotherapy tx or infusion pump during the entire visit.

## 2018-02-04 DIAGNOSIS — Z510 Encounter for antineoplastic radiation therapy: Secondary | ICD-10-CM | POA: Diagnosis not present

## 2018-02-04 NOTE — Progress Notes (Signed)
PSN spoke with daughter about care options for patient if he should need them in the future (nursing home care, hospice home)

## 2018-02-05 ENCOUNTER — Other Ambulatory Visit: Payer: Self-pay | Admitting: Oncology

## 2018-02-05 ENCOUNTER — Telehealth: Payer: Self-pay

## 2018-02-05 ENCOUNTER — Inpatient Hospital Stay: Payer: Medicare HMO

## 2018-02-05 ENCOUNTER — Other Ambulatory Visit: Payer: Self-pay

## 2018-02-05 VITALS — BP 126/80 | HR 105 | Temp 98.3°F | Resp 20

## 2018-02-05 DIAGNOSIS — C159 Malignant neoplasm of esophagus, unspecified: Secondary | ICD-10-CM

## 2018-02-05 DIAGNOSIS — K228 Other specified diseases of esophagus: Secondary | ICD-10-CM

## 2018-02-05 DIAGNOSIS — Z5111 Encounter for antineoplastic chemotherapy: Secondary | ICD-10-CM | POA: Diagnosis not present

## 2018-02-05 DIAGNOSIS — K2289 Other specified disease of esophagus: Secondary | ICD-10-CM

## 2018-02-05 LAB — COMPREHENSIVE METABOLIC PANEL
ALBUMIN: 3.9 g/dL (ref 3.5–5.0)
ALK PHOS: 209 U/L — AB (ref 38–126)
ALT: 43 U/L (ref 17–63)
ANION GAP: 13 (ref 5–15)
AST: 57 U/L — AB (ref 15–41)
BILIRUBIN TOTAL: 0.7 mg/dL (ref 0.3–1.2)
BUN: 32 mg/dL — AB (ref 6–20)
CALCIUM: 9.5 mg/dL (ref 8.9–10.3)
CO2: 25 mmol/L (ref 22–32)
Chloride: 94 mmol/L — ABNORMAL LOW (ref 101–111)
Creatinine, Ser: 1.03 mg/dL (ref 0.61–1.24)
GFR calc Af Amer: >60 mL/min (ref >60)
GFR calc non Af Amer: >60 mL/min (ref >60)
Glucose, Bld: 102 mg/dL — ABNORMAL HIGH (ref 65–99)
Potassium: 4.1 mmol/L (ref 3.5–5.1)
Sodium: 132 mmol/L — ABNORMAL LOW (ref 135–145)
TOTAL PROTEIN: 6.8 g/dL (ref 6.5–8.1)

## 2018-02-05 LAB — MAGNESIUM: Magnesium: 2 mg/dL (ref 1.7–2.4)

## 2018-02-05 LAB — PHOSPHORUS: PHOSPHORUS: 5.2 mg/dL — AB (ref 2.5–4.6)

## 2018-02-05 MED ORDER — HEPARIN SOD (PORK) LOCK FLUSH 100 UNIT/ML IV SOLN
500.0000 [IU] | Freq: Once | INTRAVENOUS | Status: AC | PRN
  Administered 2018-02-05: 500 [IU]
  Filled 2018-02-05: qty 5

## 2018-02-05 MED ORDER — ONDANSETRON HCL 4 MG/2ML IJ SOLN
8.0000 mg | Freq: Once | INTRAMUSCULAR | Status: AC
  Administered 2018-02-05: 8 mg via INTRAVENOUS
  Filled 2018-02-05: qty 4

## 2018-02-05 MED ORDER — SODIUM CHLORIDE 0.9 % IV SOLN
500.0000 mL | INTRAVENOUS | Status: DC
  Filled 2018-02-05: qty 500

## 2018-02-05 MED ORDER — ONDANSETRON 8 MG PO TBDP: 8.0000 mg | ORAL_TABLET | Freq: Three times a day (TID) | ORAL | 0 refills | Status: AC | PRN

## 2018-02-05 MED ORDER — SODIUM CHLORIDE 0.9 % IV SOLN
500.0000 mL | Freq: Once | INTRAVENOUS | Status: AC
  Administered 2018-02-05: 500 mL via INTRAVENOUS
  Filled 2018-02-05: qty 500

## 2018-02-05 MED ORDER — SODIUM CHLORIDE 0.9 % IV SOLN: Freq: Once | INTRAVENOUS | Status: DC

## 2018-02-05 MED ORDER — SODIUM CHLORIDE 0.9% FLUSH
10.0000 mL | INTRAVENOUS | Status: DC | PRN
  Administered 2018-02-05: 10 mL
  Filled 2018-02-05: qty 10

## 2018-02-05 MED ORDER — SODIUM CHLORIDE 0.9 % IV SOLN
INTRAVENOUS | Status: DC
  Filled 2018-02-05: qty 1000

## 2018-02-05 MED ORDER — DEXAMETHASONE SODIUM PHOSPHATE 10 MG/ML IJ SOLN
10.0000 mg | Freq: Once | INTRAMUSCULAR | Status: AC
  Administered 2018-02-05: 10 mg via INTRAVENOUS
  Filled 2018-02-05: qty 1

## 2018-02-05 NOTE — Telephone Encounter (Signed)
Lucianne Lei cancelled for 4/15 and 4/16 as requested by Ms. Moore. Oncology Nurse Navigator Documentation  Navigator Location: [facility] (02/05/18 1300)   )Navigator Encounter Type: Telephone (02/05/18 1300) Telephone: Incoming Call (02/05/18 1300)                                                  Time Spent with Patient: 15 (02/05/18 1300)

## 2018-02-05 NOTE — Progress Notes (Signed)
Thank you :)

## 2018-02-05 NOTE — Progress Notes (Signed)
Patient states, "I don't feel good." Patient's friend states, "He is not doing good today. He has been sick since this morning. He has been vomiting since this morning." NP, Rulon Abide, notified in person and aware. NP notified Dr. Tasia Catchings and came to see patient at chair side. NP to place further orders for today. See MAR.

## 2018-02-07 ENCOUNTER — Emergency Department: Payer: Medicare HMO

## 2018-02-07 ENCOUNTER — Other Ambulatory Visit: Payer: Self-pay

## 2018-02-07 ENCOUNTER — Inpatient Hospital Stay: Payer: Medicare HMO

## 2018-02-07 ENCOUNTER — Inpatient Hospital Stay
Admission: EM | Admit: 2018-02-07 | Discharge: 2018-02-10 | DRG: 064 | Disposition: A | Discharge status: Hospice | Payer: Medicare HMO | Attending: Internal Medicine | Admitting: Internal Medicine

## 2018-02-07 ENCOUNTER — Encounter: Payer: Self-pay | Admitting: Emergency Medicine

## 2018-02-07 DIAGNOSIS — I63512 Cerebral infarction due to unspecified occlusion or stenosis of left middle cerebral artery: Secondary | ICD-10-CM

## 2018-02-07 DIAGNOSIS — I63412 Cerebral infarction due to embolism of left middle cerebral artery: Principal | ICD-10-CM | POA: Diagnosis present

## 2018-02-07 DIAGNOSIS — F17210 Nicotine dependence, cigarettes, uncomplicated: Secondary | ICD-10-CM | POA: Diagnosis present

## 2018-02-07 DIAGNOSIS — C7800 Secondary malignant neoplasm of unspecified lung: Secondary | ICD-10-CM | POA: Diagnosis present

## 2018-02-07 DIAGNOSIS — I493 Ventricular premature depolarization: Secondary | ICD-10-CM | POA: Diagnosis present

## 2018-02-07 DIAGNOSIS — Z931 Gastrostomy status: Secondary | ICD-10-CM

## 2018-02-07 DIAGNOSIS — Z79899 Other long term (current) drug therapy: Secondary | ICD-10-CM

## 2018-02-07 DIAGNOSIS — Z66 Do not resuscitate: Secondary | ICD-10-CM | POA: Diagnosis present

## 2018-02-07 DIAGNOSIS — I639 Cerebral infarction, unspecified: Secondary | ICD-10-CM | POA: Diagnosis present

## 2018-02-07 DIAGNOSIS — Z681 Body mass index (BMI) 19 or less, adult: Secondary | ICD-10-CM | POA: Diagnosis not present

## 2018-02-07 DIAGNOSIS — R4701 Aphasia: Secondary | ICD-10-CM | POA: Diagnosis present

## 2018-02-07 DIAGNOSIS — Z9221 Personal history of antineoplastic chemotherapy: Secondary | ICD-10-CM

## 2018-02-07 DIAGNOSIS — E785 Hyperlipidemia, unspecified: Secondary | ICD-10-CM | POA: Diagnosis present

## 2018-02-07 DIAGNOSIS — R29810 Facial weakness: Secondary | ICD-10-CM | POA: Diagnosis present

## 2018-02-07 DIAGNOSIS — G893 Neoplasm related pain (acute) (chronic): Secondary | ICD-10-CM | POA: Diagnosis not present

## 2018-02-07 DIAGNOSIS — Z515 Encounter for palliative care: Secondary | ICD-10-CM | POA: Diagnosis present

## 2018-02-07 DIAGNOSIS — E43 Unspecified severe protein-calorie malnutrition: Secondary | ICD-10-CM | POA: Diagnosis present

## 2018-02-07 DIAGNOSIS — R945 Abnormal results of liver function studies: Secondary | ICD-10-CM | POA: Diagnosis present

## 2018-02-07 DIAGNOSIS — D6859 Other primary thrombophilia: Secondary | ICD-10-CM | POA: Diagnosis present

## 2018-02-07 DIAGNOSIS — R29711 NIHSS score 11: Secondary | ICD-10-CM | POA: Diagnosis present

## 2018-02-07 DIAGNOSIS — C159 Malignant neoplasm of esophagus, unspecified: Secondary | ICD-10-CM | POA: Diagnosis present

## 2018-02-07 DIAGNOSIS — E876 Hypokalemia: Secondary | ICD-10-CM | POA: Diagnosis present

## 2018-02-07 DIAGNOSIS — I10 Essential (primary) hypertension: Secondary | ICD-10-CM | POA: Diagnosis present

## 2018-02-07 DIAGNOSIS — E871 Hypo-osmolality and hyponatremia: Secondary | ICD-10-CM | POA: Diagnosis present

## 2018-02-07 DIAGNOSIS — R130 Aphagia: Secondary | ICD-10-CM | POA: Diagnosis present

## 2018-02-07 DIAGNOSIS — R609 Edema, unspecified: Secondary | ICD-10-CM | POA: Diagnosis present

## 2018-02-07 DIAGNOSIS — C787 Secondary malignant neoplasm of liver and intrahepatic bile duct: Secondary | ICD-10-CM | POA: Diagnosis present

## 2018-02-07 LAB — COMPREHENSIVE METABOLIC PANEL
ALT: 41 U/L (ref 17–63)
AST: 54 U/L — ABNORMAL HIGH (ref 15–41)
Albumin: 4.3 g/dL (ref 3.5–5.0)
Alkaline Phosphatase: 210 U/L — ABNORMAL HIGH (ref 38–126)
Anion gap: 9 (ref 5–15)
BUN: 33 mg/dL — ABNORMAL HIGH (ref 6–20)
CHLORIDE: 94 mmol/L — AB (ref 101–111)
CO2: 30 mmol/L (ref 22–32)
Calcium: 9 mg/dL (ref 8.9–10.3)
Creatinine, Ser: 1.01 mg/dL (ref 0.61–1.24)
Glucose, Bld: 128 mg/dL — ABNORMAL HIGH (ref 65–99)
POTASSIUM: 3.4 mmol/L — AB (ref 3.5–5.1)
SODIUM: 133 mmol/L — AB (ref 135–145)
Total Bilirubin: 1.5 mg/dL — ABNORMAL HIGH (ref 0.3–1.2)
Total Protein: 7 g/dL (ref 6.5–8.1)

## 2018-02-07 LAB — CBC
HEMATOCRIT: 40.1 % (ref 40.0–52.0)
Hemoglobin: 13.9 g/dL (ref 13.0–18.0)
MCH: 31.3 pg (ref 26.0–34.0)
MCHC: 34.6 g/dL (ref 32.0–36.0)
MCV: 90.4 fL (ref 80.0–100.0)
PLATELETS: 342 10*3/uL (ref 150–440)
RBC: 4.44 MIL/uL (ref 4.40–5.90)
RDW: 14.2 % (ref 11.5–14.5)
WBC: 8.7 10*3/uL (ref 3.8–10.6)

## 2018-02-07 LAB — DIFFERENTIAL
BASOS ABS: 0 10*3/uL (ref 0–0.1)
BASOS PCT: 1 %
EOS ABS: 0 10*3/uL (ref 0–0.7)
Eosinophils Relative: 0 %
Lymphocytes Relative: 10 %
Lymphs Abs: 0.9 10*3/uL — ABNORMAL LOW (ref 1.0–3.6)
MONOS PCT: 2 %
Monocytes Absolute: 0.2 10*3/uL (ref 0.2–1.0)
Neutro Abs: 7.6 10*3/uL — ABNORMAL HIGH (ref 1.4–6.5)
Neutrophils Relative %: 87 %

## 2018-02-07 LAB — TROPONIN I: TROPONIN I: 0.65 ng/mL — AB (ref <0.03)

## 2018-02-07 LAB — URINALYSIS, ROUTINE W REFLEX MICROSCOPIC
Bilirubin Urine: NEGATIVE
Glucose, UA: 50 mg/dL — AB
Hgb urine dipstick: NEGATIVE
KETONES UR: 5 mg/dL — AB
LEUKOCYTES UA: NEGATIVE
NITRITE: NEGATIVE
PROTEIN: NEGATIVE mg/dL
Specific Gravity, Urine: >1.046 — ABNORMAL HIGH (ref 1.005–1.030)
pH: 6 (ref 5.0–8.0)

## 2018-02-07 LAB — PROTIME-INR
INR: 0.99
PROTHROMBIN TIME: 13 s (ref 11.4–15.2)

## 2018-02-07 LAB — ETHANOL

## 2018-02-07 LAB — APTT: APTT: 27 s (ref 24–36)

## 2018-02-07 LAB — GLUCOSE, CAPILLARY: Glucose-Capillary: 121 mg/dL — ABNORMAL HIGH (ref 65–99)

## 2018-02-07 MED ORDER — ORAL CARE MOUTH RINSE
15.0000 mL | Freq: Two times a day (BID) | OROMUCOSAL | Status: DC
  Administered 2018-02-07 – 2018-02-09 (×4): 15 mL via OROMUCOSAL
  Filled 2018-02-07: qty 15

## 2018-02-07 MED ORDER — ASPIRIN 325 MG PO TABS
325.0000 mg | ORAL_TABLET | Freq: Every day | ORAL | Status: DC
  Administered 2018-02-08 – 2018-02-09 (×2): 325 mg
  Filled 2018-02-07 (×3): qty 1

## 2018-02-07 MED ORDER — ACETAMINOPHEN 325 MG PO TABS: 650.0000 mg | ORAL_TABLET | ORAL | Status: DC | PRN

## 2018-02-07 MED ORDER — SENNOSIDES-DOCUSATE SODIUM 8.6-50 MG PO TABS: 1.0000 | ORAL_TABLET | Freq: Every evening | ORAL | Status: DC | PRN

## 2018-02-07 MED ORDER — ACETAMINOPHEN 650 MG RE SUPP: 650.0000 mg | RECTAL | Status: DC | PRN

## 2018-02-07 MED ORDER — STROKE: EARLY STAGES OF RECOVERY BOOK
Freq: Once | Status: AC
  Administered 2018-02-07: 15:00:00

## 2018-02-07 MED ORDER — IOHEXOL 350 MG/ML SOLN
75.0000 mL | Freq: Once | INTRAVENOUS | Status: AC | PRN
  Administered 2018-02-07: 75 mL via INTRAVENOUS

## 2018-02-07 MED ORDER — ONDANSETRON HCL 4 MG/2ML IJ SOLN: 4.0000 mg | Freq: Four times a day (QID) | INTRAMUSCULAR | Status: DC | PRN

## 2018-02-07 MED ORDER — CHLORHEXIDINE GLUCONATE 0.12 % MT SOLN
15.0000 mL | Freq: Two times a day (BID) | OROMUCOSAL | Status: DC
  Administered 2018-02-07 – 2018-02-09 (×5): 15 mL via OROMUCOSAL
  Filled 2018-02-07 (×5): qty 15

## 2018-02-07 MED ORDER — LORAZEPAM 2 MG/ML IJ SOLN
1.0000 mg | Freq: Four times a day (QID) | INTRAMUSCULAR | Status: DC | PRN
  Administered 2018-02-07 – 2018-02-09 (×3): 1 mg via INTRAVENOUS
  Filled 2018-02-07 (×3): qty 1

## 2018-02-07 MED ORDER — ASPIRIN 300 MG RE SUPP
300.0000 mg | Freq: Once | RECTAL | Status: AC
  Administered 2018-02-07: 300 mg via RECTAL
  Filled 2018-02-07: qty 1

## 2018-02-07 MED ORDER — ACETAMINOPHEN 160 MG/5ML PO SOLN
650.0000 mg | ORAL | Status: DC | PRN
  Filled 2018-02-07: qty 20.3

## 2018-02-07 MED ORDER — IOHEXOL 350 MG/ML SOLN
40.0000 mL | Freq: Once | INTRAVENOUS | Status: AC | PRN
  Administered 2018-02-07: 40 mL via INTRAVENOUS

## 2018-02-07 MED ORDER — ENOXAPARIN SODIUM 40 MG/0.4ML ~~LOC~~ SOLN: 40.0000 mg | SUBCUTANEOUS | Status: DC

## 2018-02-07 MED ORDER — ENOXAPARIN SODIUM 40 MG/0.4ML ~~LOC~~ SOLN
40.0000 mg | Freq: Every day | SUBCUTANEOUS | Status: DC
  Administered 2018-02-08 – 2018-02-09 (×2): 40 mg via SUBCUTANEOUS
  Filled 2018-02-07 (×2): qty 0.4

## 2018-02-07 MED ORDER — FENTANYL 25 MCG/HR TD PT72
25.0000 ug | MEDICATED_PATCH | TRANSDERMAL | Status: DC
  Administered 2018-02-07: 17:00:00 25 ug via TRANSDERMAL
  Filled 2018-02-07: qty 1

## 2018-02-07 MED ORDER — SODIUM CHLORIDE 0.9 % IV SOLN
INTRAVENOUS | Status: DC
  Administered 2018-02-07 – 2018-02-09 (×5): via INTRAVENOUS

## 2018-02-07 MED ORDER — BISACODYL 10 MG RE SUPP
10.0000 mg | Freq: Every day | RECTAL | Status: DC | PRN
  Filled 2018-02-07: qty 1

## 2018-02-07 NOTE — ED Triage Notes (Addendum)
Woke up this morning with complete aphasia. Drooling noted to right side of mouth. Not following commands. Unsure if VAN positive as pt will not squeeze hands when asked.

## 2018-02-07 NOTE — Progress Notes (Signed)
Advanced Care Plan.  Purpose of Encounter: CODE STATUS, palliative care and hospice care. Parties in Attendance: The patient's daughter Evan Dine), his girlfriend and other family members, and me. Patient's Decisional Capacity: No. Medical Story: Evan Mason  is a 72 y.o. 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  He is in stage IV esophageal cancer with metastasis.  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  Patient has severe malnutrition.  On PEG tube feeding.  He has very poor prognosis.  I discussed with his family member about DNR, medical care and hospice care.  The daughter who is his POA, decided DNR and agree to discussed with palliative care team for possible pain care or hospice care.   Goals of Care Determinations: Palliative care or hospice care. Plan:  Code Status: DNR. Time spent discussing advance care planning: 25 minutes.

## 2018-02-07 NOTE — Progress Notes (Signed)
CODE STROKE- PHARMACY COMMUNICATION  Time CODE STROKE called/page received:[id number]  Time Stroke Kit retrieved from Pyxis (only if needed): N/A  tPA not given: Patient not candidate for tPA.   Past Medical History:  Diagnosis Date  . Basal cell carcinoma   . Esophageal mass 01/20/2018   From PET scan order  . Hyperlipidemia   . Hypertension    Prior to Admission medications   Medication Sig Start Date End Date Taking? Authorizing Provider  amLODipine (NORVASC) 10 MG tablet Take 10 mg by mouth daily.    [provider]  aspirin EC 81 MG tablet Take 81 mg by mouth daily.    [provider]  dexlansoprazole (DEXILANT) 60 MG capsule Take 60 mg by mouth daily.    [provider]  fentaNYL (DURAGESIC - DOSED MCG/HR) 25 MCG/HR patch Place 1 patch (25 mcg total) onto the skin every 3 (three) days. 01/25/18   Earlie Server, MD  hydrochlorothiazide (HYDRODIURIL) 25 MG tablet Take 25 mg by mouth daily.    [provider]  lidocaine-prilocaine (EMLA) cream Apply to affected area once 01/30/18   Earlie Server, MD  lisinopril (PRINIVIL,ZESTRIL) 40 MG tablet Take 40 mg by mouth daily.    [provider]  Multiple Vitamin (MULTIVITAMIN WITH MINERALS) TABS tablet Take 1 tablet by mouth daily.    [provider]  Nutritional Supplements (FEEDING SUPPLEMENT, OSMOLITE 1.5 CAL,) LIQD Give 1 carton of osmolite 1.5, 5 times per day via feeding tube.  Flush with 7m of water before and after feeding osmolite.  Start with 1/2 carton of tube feeding 5 times per day.  Add 1/2 carton of tube feeding daily until goal rate reached. 02/01/18   YEarlie Server MD  Omega-3 Fatty Acids (FISH OIL PO) Take 1 capsule by mouth daily.    [provider]  ondansetron (ZOFRAN ODT) 8 MG disintegrating tablet Take 1 tablet (8 mg total) by mouth every 8 (eight) hours as needed for nausea or vomiting. 02/05/18   BJacquelin Hawking NP  ondansetron (ZOFRAN) 8 MG tablet Take 1 tablet (8 mg total)  by mouth 2 (two) times daily as needed for refractory nausea / vomiting. Start on day 3 after chemotherapy. 01/30/18   YEarlie Server MD  prochlorperazine (COMPAZINE) 10 MG tablet Take 1 tablet (10 mg total) by mouth every 6 (six) hours as needed (Nausea or vomiting). 01/30/18   YEarlie Server MD  sildenafil (REVATIO) 20 MG tablet Take 40-100 mg by mouth daily as needed (erectile dysfunction).     [provider]    SCandelaria Stagers,PharmD Pharmacy Resident  02/07/2018  11:49 AM

## 2018-02-07 NOTE — H&P (Signed)
[facility] NAME: Evan Mason    MR#:  [id number]  DATE OF BIRTH:  [birth year]  DATE OF ADMISSION:  02/07/2018  PRIMARY CARE PHYSICIAN: Kirk Ruths, MD   REQUESTING/REFERRING PHYSICIAN: Darel Hong, MD  CHIEF COMPLAINT:   Chief Complaint  Patient presents with  . Aphasia   Aphasia since this morning. HISTORY OF PRESENT ILLNESS:  Evan Mason  is a 72 y.o. 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  The patient was found aphasia since this morning.  Per family members, the patient was fine yesterday and last night.  He completed his first round of chemotherapy 1 week ago.  Per ED physician, the patient had aphasia and right-sided neglect.  CT angiogram of the head show acute left MCA CVA. PAST MEDICAL HISTORY:   Past Medical History:  Diagnosis Date  . Basal cell carcinoma   . Esophageal mass 01/20/2018   From PET scan order  . Hyperlipidemia   . Hypertension     PAST SURGICAL HISTORY:   Past Surgical History:  Procedure Laterality Date  . COLONOSCOPY WITH PROPOFOL N/A 12/27/2015   Procedure: COLONOSCOPY WITH PROPOFOL;  Surgeon: Manya Silvas, MD;  Location: [facility] ENDOSCOPY;  Service: Endoscopy;  Laterality: N/A;  . ESOPHAGOGASTRODUODENOSCOPY (EGD) WITH PROPOFOL N/A 01/19/2018   Procedure: ESOPHAGOGASTRODUODENOSCOPY (EGD) WITH PROPOFOL;  Surgeon: Lollie Sails, MD;  Location: [facility] ENDOSCOPY;  Service: Endoscopy;  Laterality: N/A;  . IR GASTROSTOMY TUBE MOD SED  01/29/2018  . MOHS SURGERY    . PORTA CATH INSERTION N/A 02/02/2018   Procedure: PORTA CATH INSERTION;  Surgeon: Katha Cabal, MD;  Location: [place] [facility] LAB;  Service: Cardiovascular;  Laterality: N/A;    SOCIAL HISTORY:   Social History   Tobacco Use  . Smoking status: Current Every Day Smoker    Packs/day: 0.25    Types: Cigarettes  .  Smokeless tobacco: Never Used  Substance Use Topics  . Alcohol use: No    FAMILY HISTORY:   Family History  Problem Relation Age of Onset  . Diabetes Mother     DRUG ALLERGIES:  No Known Allergies  REVIEW OF SYSTEMS:   Review of Systems  Unable to perform ROS: Medical condition    MEDICATIONS AT HOME:   Prior to Admission medications   Medication Sig Start Date End Date Taking? Authorizing Provider  Nutritional Supplements (FEEDING SUPPLEMENT, OSMOLITE 1.5 CAL,) LIQD Give 1 carton of osmolite 1.5, 5 times per day via feeding tube.  Flush with 47ml of water before and after feeding osmolite.  Start with 1/2 carton of tube feeding 5 times per day.  Add 1/2 carton of tube feeding daily until goal rate reached. 02/01/18  Yes Earlie Server, MD  ondansetron (ZOFRAN ODT) 8 MG disintegrating tablet Take 1 tablet (8 mg total) by mouth every 8 (eight) hours as needed for nausea or vomiting. 02/05/18  Yes Jacquelin Hawking, NP  prochlorperazine (COMPAZINE) 10 MG tablet Take 1 tablet (10 mg total) by mouth every 6 (six) hours as needed (Nausea or vomiting). 01/30/18  Yes Earlie Server, MD  fentaNYL (DURAGESIC - DOSED MCG/HR) 25 MCG/HR patch Place 1 patch (25 mcg total) onto the skin every 3 (three) days. 01/25/18   Earlie Server, MD  lidocaine-prilocaine (EMLA) cream Apply to affected area once Patient not taking: Reported on 02/07/2018 01/30/18   Earlie Server, MD  ondansetron (ZOFRAN) 8 MG tablet Take 1 tablet (8 mg total) by mouth 2 (two) times daily as needed for refractory nausea / vomiting. Start on day 3 after chemotherapy. Patient not taking: Reported on 02/07/2018 01/30/18   Earlie Server, MD      VITAL SIGNS:  Blood pressure (!) 143/81, pulse 78, temperature (!) 97.5 F (36.4 C), temperature source Oral, resp. rate 16, height 5\' 8"  (1.727 m), weight 124 lb (56.2 kg), SpO2 (!) 69 %.  PHYSICAL EXAMINATION:  Physical Exam  GENERAL:  72 y.o.-year-old patient lying in the bed with no acute distress.  Severe  malnutrition. EYES: Pupils equal, round, reactive to light. No scleral icterus. Extraocular muscles intact.  HEENT: Head atraumatic, normocephalic. Oropharynx and nasopharynx clear.  NECK:  Supple, no jugular venous distention. No thyroid enlargement, no tenderness.  LUNGS: Normal breath sounds bilaterally, no wheezing, rales,rhonchi or crepitation. No use of accessory muscles of respiration.  CARDIOVASCULAR: S1, S2 normal. No murmurs, rubs, or gallops.  ABDOMEN: Soft, nontender, nondistended. Bowel sounds present.Marland Kitchen  PEG in situ. EXTREMITIES: No pedal edema, cyanosis, or clubbing.  NEUROLOGIC: Aphasia.  Right facial droop and right sided neglect, he is able to move both arms and leg. PSYCHIATRIC: The patient is alert.  SKIN: No obvious rash, lesion, or ulcer.   LABORATORY PANEL:   CBC Recent Labs  Lab 02/07/18 1108  WBC 8.7  HGB 13.9  HCT 40.1  PLT 342   ------------------------------------------------------------------------------------------------------------------  Chemistries  Recent Labs  Lab 02/05/18 1521 02/07/18 1108  NA 132* 133*  K 4.1 3.4*  CL 94* 94*  CO2 25 30  GLUCOSE 102* 128*  BUN 32* 33*  CREATININE 1.03 1.01  CALCIUM 9.5 9.0  MG 2.0  --   AST 57* 54*  ALT 43 41  ALKPHOS 209* 210*  BILITOT 0.7 1.5*   ------------------------------------------------------------------------------------------------------------------  Cardiac Enzymes Recent Labs  Lab 02/07/18 1108  TROPONINI 0.65*   ------------------------------------------------------------------------------------------------------------------  RADIOLOGY:  Ct Angio Head W Or Wo Contrast  Result Date: 02/07/2018 CLINICAL DATA:  72 year old male with early cytotoxic edema in the left MCA territory on noncontrast head CT today performed for new aphasia and right side symptoms. EXAM: CT ANGIOGRAPHY HEAD AND NECK CT PERFUSION BRAIN TECHNIQUE: Multidetector CT imaging of the head and neck was  performed using the standard protocol during bolus administration of intravenous contrast. Multiplanar CT image reconstructions and MIPs were obtained to evaluate the vascular anatomy. Carotid stenosis measurements (when applicable) are obtained utilizing NASCET criteria, using the distal internal carotid diameter as the denominator. Multiphase CT imaging of the brain was performed following IV bolus contrast injection. Subsequent parametric perfusion maps were calculated using RAPID software. CONTRAST:  Total of 115 milliliters Omnipaque 350 COMPARISON:  Head CT without contrast 1048 hours today. FINDINGS: CT Brain Perfusion Findings: ASPECTS 7 at 1048 hours today. CBF (<30%) Volume: 12 milliliters, which appears to overlap the cytotoxic edema detected by plain head CT about the left insula and frontal operculum. Perfusion (Tmax>6.0s) volume: 19 milliliters Mismatch Volume: 7 milliliters Infarction Location:Left MCA. CTA NECK Skeleton: Osteopenia. Mild motion artifact. No acute osseous abnormality identified. Upper chest: Upper lung motion artifact, the lungs appear clear. No superior mediastinal lymphadenopathy identified. Other neck: Mild motion artifact. No neck mass or lymphadenopathy identified. Aortic arch: Calcified aortic atherosclerosis. Three vessel arch configuration with mild to moderate motion artifact at the arch and thoracic inlet. Right carotid system: Motion artifact affecting the right CCA in the upper chest. In the neck the right CCA  is patent. There is calcified and soft plaque at the medial right ICA origin and bulb resulting in less than 50 % stenosis with respect to the distal vessel. Mildly tortuous right ICA below the skull base. Left carotid system: Motion artifact affecting the left CCA in the upper chest. In the neck the left CCA is patent with minimal to mild soft and calcified plaque at the left ICA origin. No cervical left ICA stenosis. Vertebral arteries: Motion artifact affecting  the proximal right subclavian artery. The right vertebral artery origin is patent and appears grossly normal. The right vertebral artery is dominant and patent to the skull base. There is mild right V3 segment calcified plaque without significant stenosis. Motion artifact affecting the proximal left subclavian artery in the upper chest. The left vertebral artery origin is patent but otherwise not well evaluated. The left vertebral artery is non dominant with intermittent V2 segment calcified plaque. The left vertebral remains patent to the skull base. CTA HEAD Posterior circulation: Dominant distal right vertebral artery. Bilateral V4 segment calcified plaque but only mild left V4 segment stenosis. Patent vertebrobasilar junction. The a ICAs appear dominant and patent. Patent basilar artery without stenosis. The SCA and PCA origins are patent. Both P1 segments are mildly tortuous. Posterior communicating arteries are diminutive or absent. Bilateral PCA branches are within normal limits. Anterior circulation: Bilateral ICA siphon calcified plaque. Both siphons remain patent. Mild bilateral siphon stenosis. Normal ophthalmic artery origins. Patent carotid termini. Patent MCA and ACA origins with mild irregularity. Mild bilateral MCA origins stenosis. The left A2 scratched at the proximal left A2 segment is mildly ectatic. The anterior communicating artery is diminutive or absent. Bilateral ACA branches otherwise appear normal. The right MCA M1 segment, bifurcation, and right MCA branches are within normal limits. The left MCA M1 segment is patent and bifurcates early. There is no proximal M2 branch occlusion or stenosis. There is irregularity at a a left MCA branch bifurcation affecting M3 origins on series 13, image 152. It is possible there is a small M3 branch occlusion here, or more distally. Venous sinuses: Patent. Anatomic variants: Dominant right vertebral artery. Delayed phase: Stable gray-white matter  differentiation throughout the brain. Patchy cytotoxic edema at the left insula and frontal operculum. No abnormal enhancement identified. Review of the MIP images confirms the above findings IMPRESSION: 1. Negative for emergent large vessel occlusion. There distal Left MCA (image 3) branch irregularity and possible occlusion. 2. CT Perfusion detects associated small Left MCA middle division core infarct with a volume of 12 mL, corresponding to the cytotoxic edema on plain head CT today, with little additional penumbra (mismatch volume of only 7 mL). 3. Stable CT appearance of the brain since 1048 hours, no mass effect or hemorrhage. 4. Positive intra- and extracranial atherosclerosis, although no other hemodynamically significant stenosis is identified in the head and neck. 5. Motion artifact in the upper chest and at the thoracic inlet limiting evaluation of the aortic arch and proximal great vessels. Electronically Signed   By: Genevie Ann M.D.   On: 02/07/2018 12:16   Ct Angio Neck W And/or Wo Contrast  Result Date: 02/07/2018 CLINICAL DATA:  72 year old male with early cytotoxic edema in the left MCA territory on noncontrast head CT today performed for new aphasia and right side symptoms. EXAM: CT ANGIOGRAPHY HEAD AND NECK CT PERFUSION BRAIN TECHNIQUE: Multidetector CT imaging of the head and neck was performed using the standard protocol during bolus administration of intravenous contrast. Multiplanar CT image reconstructions  and MIPs were obtained to evaluate the vascular anatomy. Carotid stenosis measurements (when applicable) are obtained utilizing NASCET criteria, using the distal internal carotid diameter as the denominator. Multiphase CT imaging of the brain was performed following IV bolus contrast injection. Subsequent parametric perfusion maps were calculated using RAPID software. CONTRAST:  Total of 115 milliliters Omnipaque 350 COMPARISON:  Head CT without contrast 1048 hours today. FINDINGS: CT  Brain Perfusion Findings: ASPECTS 7 at 1048 hours today. CBF (<30%) Volume: 12 milliliters, which appears to overlap the cytotoxic edema detected by plain head CT about the left insula and frontal operculum. Perfusion (Tmax>6.0s) volume: 19 milliliters Mismatch Volume: 7 milliliters Infarction Location:Left MCA. CTA NECK Skeleton: Osteopenia. Mild motion artifact. No acute osseous abnormality identified. Upper chest: Upper lung motion artifact, the lungs appear clear. No superior mediastinal lymphadenopathy identified. Other neck: Mild motion artifact. No neck mass or lymphadenopathy identified. Aortic arch: Calcified aortic atherosclerosis. Three vessel arch configuration with mild to moderate motion artifact at the arch and thoracic inlet. Right carotid system: Motion artifact affecting the right CCA in the upper chest. In the neck the right CCA is patent. There is calcified and soft plaque at the medial right ICA origin and bulb resulting in less than 50 % stenosis with respect to the distal vessel. Mildly tortuous right ICA below the skull base. Left carotid system: Motion artifact affecting the left CCA in the upper chest. In the neck the left CCA is patent with minimal to mild soft and calcified plaque at the left ICA origin. No cervical left ICA stenosis. Vertebral arteries: Motion artifact affecting the proximal right subclavian artery. The right vertebral artery origin is patent and appears grossly normal. The right vertebral artery is dominant and patent to the skull base. There is mild right V3 segment calcified plaque without significant stenosis. Motion artifact affecting the proximal left subclavian artery in the upper chest. The left vertebral artery origin is patent but otherwise not well evaluated. The left vertebral artery is non dominant with intermittent V2 segment calcified plaque. The left vertebral remains patent to the skull base. CTA HEAD Posterior circulation: Dominant distal right  vertebral artery. Bilateral V4 segment calcified plaque but only mild left V4 segment stenosis. Patent vertebrobasilar junction. The a ICAs appear dominant and patent. Patent basilar artery without stenosis. The SCA and PCA origins are patent. Both P1 segments are mildly tortuous. Posterior communicating arteries are diminutive or absent. Bilateral PCA branches are within normal limits. Anterior circulation: Bilateral ICA siphon calcified plaque. Both siphons remain patent. Mild bilateral siphon stenosis. Normal ophthalmic artery origins. Patent carotid termini. Patent MCA and ACA origins with mild irregularity. Mild bilateral MCA origins stenosis. The left A2 scratched at the proximal left A2 segment is mildly ectatic. The anterior communicating artery is diminutive or absent. Bilateral ACA branches otherwise appear normal. The right MCA M1 segment, bifurcation, and right MCA branches are within normal limits. The left MCA M1 segment is patent and bifurcates early. There is no proximal M2 branch occlusion or stenosis. There is irregularity at a a left MCA branch bifurcation affecting M3 origins on series 13, image 152. It is possible there is a small M3 branch occlusion here, or more distally. Venous sinuses: Patent. Anatomic variants: Dominant right vertebral artery. Delayed phase: Stable gray-white matter differentiation throughout the brain. Patchy cytotoxic edema at the left insula and frontal operculum. No abnormal enhancement identified. Review of the MIP images confirms the above findings IMPRESSION: 1. Negative for emergent large vessel occlusion. There  distal Left MCA (image 3) branch irregularity and possible occlusion. 2. CT Perfusion detects associated small Left MCA middle division core infarct with a volume of 12 mL, corresponding to the cytotoxic edema on plain head CT today, with little additional penumbra (mismatch volume of only 7 mL). 3. Stable CT appearance of the brain since 1048 hours, no mass  effect or hemorrhage. 4. Positive intra- and extracranial atherosclerosis, although no other hemodynamically significant stenosis is identified in the head and neck. 5. Motion artifact in the upper chest and at the thoracic inlet limiting evaluation of the aortic arch and proximal great vessels. Electronically Signed   By: Genevie Ann M.D.   On: 02/07/2018 12:16   Ct Cerebral Perfusion W Contrast  Result Date: 02/07/2018 CLINICAL DATA:  72 year old male with early cytotoxic edema in the left MCA territory on noncontrast head CT today performed for new aphasia and right side symptoms. EXAM: CT ANGIOGRAPHY HEAD AND NECK CT PERFUSION BRAIN TECHNIQUE: Multidetector CT imaging of the head and neck was performed using the standard protocol during bolus administration of intravenous contrast. Multiplanar CT image reconstructions and MIPs were obtained to evaluate the vascular anatomy. Carotid stenosis measurements (when applicable) are obtained utilizing NASCET criteria, using the distal internal carotid diameter as the denominator. Multiphase CT imaging of the brain was performed following IV bolus contrast injection. Subsequent parametric perfusion maps were calculated using RAPID software. CONTRAST:  Total of 115 milliliters Omnipaque 350 COMPARISON:  Head CT without contrast 1048 hours today. FINDINGS: CT Brain Perfusion Findings: ASPECTS 7 at 1048 hours today. CBF (<30%) Volume: 12 milliliters, which appears to overlap the cytotoxic edema detected by plain head CT about the left insula and frontal operculum. Perfusion (Tmax>6.0s) volume: 19 milliliters Mismatch Volume: 7 milliliters Infarction Location:Left MCA. CTA NECK Skeleton: Osteopenia. Mild motion artifact. No acute osseous abnormality identified. Upper chest: Upper lung motion artifact, the lungs appear clear. No superior mediastinal lymphadenopathy identified. Other neck: Mild motion artifact. No neck mass or lymphadenopathy identified. Aortic arch: Calcified  aortic atherosclerosis. Three vessel arch configuration with mild to moderate motion artifact at the arch and thoracic inlet. Right carotid system: Motion artifact affecting the right CCA in the upper chest. In the neck the right CCA is patent. There is calcified and soft plaque at the medial right ICA origin and bulb resulting in less than 50 % stenosis with respect to the distal vessel. Mildly tortuous right ICA below the skull base. Left carotid system: Motion artifact affecting the left CCA in the upper chest. In the neck the left CCA is patent with minimal to mild soft and calcified plaque at the left ICA origin. No cervical left ICA stenosis. Vertebral arteries: Motion artifact affecting the proximal right subclavian artery. The right vertebral artery origin is patent and appears grossly normal. The right vertebral artery is dominant and patent to the skull base. There is mild right V3 segment calcified plaque without significant stenosis. Motion artifact affecting the proximal left subclavian artery in the upper chest. The left vertebral artery origin is patent but otherwise not well evaluated. The left vertebral artery is non dominant with intermittent V2 segment calcified plaque. The left vertebral remains patent to the skull base. CTA HEAD Posterior circulation: Dominant distal right vertebral artery. Bilateral V4 segment calcified plaque but only mild left V4 segment stenosis. Patent vertebrobasilar junction. The a ICAs appear dominant and patent. Patent basilar artery without stenosis. The SCA and PCA origins are patent. Both P1 segments are mildly tortuous. Posterior  communicating arteries are diminutive or absent. Bilateral PCA branches are within normal limits. Anterior circulation: Bilateral ICA siphon calcified plaque. Both siphons remain patent. Mild bilateral siphon stenosis. Normal ophthalmic artery origins. Patent carotid termini. Patent MCA and ACA origins with mild irregularity. Mild bilateral  MCA origins stenosis. The left A2 scratched at the proximal left A2 segment is mildly ectatic. The anterior communicating artery is diminutive or absent. Bilateral ACA branches otherwise appear normal. The right MCA M1 segment, bifurcation, and right MCA branches are within normal limits. The left MCA M1 segment is patent and bifurcates early. There is no proximal M2 branch occlusion or stenosis. There is irregularity at a a left MCA branch bifurcation affecting M3 origins on series 13, image 152. It is possible there is a small M3 branch occlusion here, or more distally. Venous sinuses: Patent. Anatomic variants: Dominant right vertebral artery. Delayed phase: Stable gray-white matter differentiation throughout the brain. Patchy cytotoxic edema at the left insula and frontal operculum. No abnormal enhancement identified. Review of the MIP images confirms the above findings IMPRESSION: 1. Negative for emergent large vessel occlusion. There distal Left MCA (image 3) branch irregularity and possible occlusion. 2. CT Perfusion detects associated small Left MCA middle division core infarct with a volume of 12 mL, corresponding to the cytotoxic edema on plain head CT today, with little additional penumbra (mismatch volume of only 7 mL). 3. Stable CT appearance of the brain since 1048 hours, no mass effect or hemorrhage. 4. Positive intra- and extracranial atherosclerosis, although no other hemodynamically significant stenosis is identified in the head and neck. 5. Motion artifact in the upper chest and at the thoracic inlet limiting evaluation of the aortic arch and proximal great vessels. Electronically Signed   By: Genevie Ann M.D.   On: 02/07/2018 12:16   Ct Head Code Stroke Wo Contrast  Addendum Date: 02/07/2018   ADDENDUM REPORT: 02/07/2018 11:21 ADDENDUM: Study discussed by telephone with Dr. Marjean Donna on 02/07/2018 at 1112 hours. Electronically Signed   By: Genevie Ann M.D.   On: 02/07/2018 11:21   Result Date:  02/07/2018 CLINICAL DATA:  Code stroke. 72 year old male who awoke this morning with aphasia and ruling out of the right side of the mouth. Metastatic esophageal cancer. EXAM: CT HEAD WITHOUT CONTRAST TECHNIQUE: Contiguous axial images were obtained from the base of the skull through the vertex without intravenous contrast. COMPARISON:  Brain MRI 01/29/2018. FINDINGS: Brain: Patchy chronic bilateral white matter hypodensity. However, there is new cortical hypodensity along the mid insula and overlying operculum best seen on coronal image 31. The left basal ganglia and internal capsule appear have normal gray-white matter differentiation. There is subtle cortical hypodensity suspected elsewhere at the left frontal operculum on series 2, image 21. No associated hemorrhage or mass effect. No cytotoxic edema in the right hemisphere or posterior fossa identified. No ventriculomegaly. No intracranial mass lesion or mass effect. Vascular: Calcified atherosclerosis at the skull base. Skull: No suspicious intracranial vascular hyperdensity. Sinuses/Orbits: Clear. Other: Leftward gaze deviation. Visualized scalp soft tissues are within normal limits. ASPECTS [facility] Stroke Program Early CT Score) - Ganglionic level infarction (caudate, lentiform nuclei, internal capsule, insula, M1-M3 cortex): 5 (abnormal insula and M1 segment). - Supraganglionic infarction (M4-M6 cortex): 2 (abnormal M 5 segment). Total score (0-10 with 10 being normal): 7 IMPRESSION: 1. Cytotoxic edema in the left insula and frontal operculum compatible with acute/evolving left MCA territory infarct.ASPECTS is 7 out of 10. 2. No associated hemorrhage or intracranial mass effect. 3.  Underlying chronic bilateral cerebral white matter disease as seen on the recent MRI. Electronically Signed: By: Genevie Ann M.D. On: 02/07/2018 11:10      IMPRESSION AND PLAN:   Acute left MCA infarct with aphagia The patient will be admitted to medical floor. Continue  aspirin by PEG tube.  Canceled MRI of the brain, echocardiograph and carotid duplex after discussion with family members.  Palliative care consult.  Stage IV esophageal cancer, on PEG tube feeding. Continue PEG tube feeding.  Palliative care consult.  Hyponatremia and hypokalemia.  Give IV fluids and potassium supplement. Abnormal liver function test.  Possible related to esophageal cancer with liver metastasis.  Severe malnutrition.  Dietitian consult.  The patient has very poor prognosis.  All the records are reviewed and case discussed with ED provider. Management plans discussed with the patient, family and they are in agreement.  CODE STATUS: DNR.  TOTAL TIME TAKING CARE OF THIS PATIENT: 52 minutes.    Demetrios Loll M.D on 02/07/2018 at 2:32 PM  Between 7am to 6pm - Pager - [phone]  After 6pm go to [email or web address] - [employer] [facility] [facility]  Office  [phone]  CC: Primary care physician; Kirk Ruths, MD   Note: This dictation was prepared with Dragon dictation along with smaller phrase technology. Any transcriptional errors that result from this process are unin

## 2018-02-07 NOTE — Consult Note (Addendum)
[facility] Tele[facility]urology Consult Services   DATE: February 07, 2018 Impression: Stroke- pateint has an stage IV metastatic esophageal cancer and already has early ischemic changes in the left MCA distribution with an aspect score 7.  CT angios and perfusion are pending, however suspect he would not be an ideal neuro-interventional candidate.  Of course if he has significant penumbra left happy to discuss the case with neuro-interventional as the patient is otherwise functional.  Not a tpa candidate due to: Last known normal over 4.5 hours ago  Symptoms consistent with possible large vessel occlusion CT angiogram perfusion are pending  Differential Diagnosis: 1. Cardioembolic stroke 2. Small vessel disease/lacune 3. Thromboembolic, artery-to-artery mechanism 4. Hypercoagulable state-related infarct 5. Transient ischemic attack 6. Thrombotic mechanism, large artery disease  Comments: Last known normal 23:00 Door time 10:40 [facility] contacted: 11:06 [facility] at bedside: 11:08 NIHSS assessment time: 11:08  Recommendations: -Follow up CT and U head and neck and perfusion if there is a large area of September can certainly discuss the case with neuro-interventional -Aspirin -Admission for stroke workup and neurology consultation-permissive hypertension per non-TPA stroke protocol   Inpatient neurology consultation Inpatient stroke evaluation as per Neurology/ Internal Medicine Discussed with ED MD Please call with questions ----------------------------------------------------------------------------------------- CC aphasia and right facial droop  History of Present Illness  Patient is an unfortunate 72 year old gentleman who was recently diagnosed with metastatic esophageal cancer with liver metastases just in March.  He just started chemotherapy on Wednesday the agent isn't known.  He has no other health problems really according to the patient's family.  He does  have a feeding tube is benign of the swallow due to the esophageal cancer.  This morning he got out of bed around 9 AM and his family noticed he had a right facial droop and wasn't speaking and was aphasic.  He did have a recent MRI of the brain that did not show any brain metastases.  He has no history of stroke or other vascular risk factors according to his family  Diagnostic: CT head without contrast showed early ischemic changes in the left MCA distribution with aspect score of 7 no hemorrhage  Exam: 1a- LOC: Keenly responsive - =0     1b- LOC questions: Answers no questions globally aphasic=2    1c- LOC commands- Performs no tasks globally aphasic=2     2- Gaze: Not able to completely look to the right although he does cross midline=1  3- Visual Fields: normal, no Visual field deficit - 0     4- Facial movements: Right lower facial droop=1   5- Upper limb motor - no drift -0     6- Lower limb motor - no drift - 0    the patient won't cooperate with examination but seems to move both legs fairly symmetrically  7- Limb Coordination: absent ataxia - 0      8- Sensory : no sensory loss - 0      9- Language =3    10- Speech -=2    11- Neglect / Extinction - none found -0     NIHSS score  11   Medical Decision Making: - Extensive number of diagnosis or management options are considered above. - Extensive amount of complex data reviewed. - High risk of complication and/or morbidity or mortality are associated with differential diagnostic considerations above. - There may be Uncertain outcome and increased probability of prolonged functional impairment or high probability of severe prolonged functional impairment associated with some of these differential  diagnosis. Medical Data Reviewed: 1.Data reviewed include clinical labs, radiology, Medical Tests; 2.Tests results discussed w/performing or interpreting physician; 3.Obtaining/reviewing old medical records; 4.Obtaining case history  from another source; 5.Independent review of image, tracing or specimen. Patient was informed the Neurology Consult would happen via telehealth (remote video) and consented to receiving care in this manner.   ADDENDUM: Patient has a distal M3 occlusion in the L MCA no LVO amenable to NIR, mostly core infarct as well, recommendations as above.  DW ED MD

## 2018-02-07 NOTE — ED Notes (Addendum)
Pt unable to follow commands, pt aphasic, pt frequently tapping on family members, occasionally will shake head.  Pt has not spoken any words since arriving to ED.   Unsure if VAN score is truly positive or negative as patient is not following commands and not speaking.

## 2018-02-07 NOTE — Progress Notes (Signed)
   02/07/18 1530  Clinical Encounter Type  Visited With Patient and family together  Visit Type Follow-up (order for advanced directive)  Referral From Nurse  Consult/Referral To Chaplain   Chaplain responded to order regarding advanced directive.  Patient in bed, daughter and another family member in room.  Chaplain asked about advanced directive interest.  Patient smiled through encounter and did not verbally engage.  Daughter stated that she would be lead in his care and that there was no need for the document.  Chaplain spoke of ongoing chaplain availability for support and encouraged family to have chaplain paged as needed.

## 2018-02-07 NOTE — ED Provider Notes (Addendum)
[facility] Emergency Department Provider Note  ____________________________________________   First MD Initiated Contact with Patient 02/07/18 1051     (approximate)  I have reviewed the triage vital signs and the nursing notes.   HISTORY  Chief Complaint Aphasia  Level 5 exemption history limited by the patient's clinical condition  HPI Evan Mason is a 72 y.o. male who self presents to the emergency department with his wife with aphasia.  His last known well was around 11:30 PM last night roughly 11 hours prior to arrival.  The patient has a past medical history of stage IV esophageal cancer with known pulmonary and liver metastases.  He completed his first round of chemotherapy 1 week ago.  He was in his usual state of health last night when he went to sleep however when he awoke this morning he has been unable to speak.  Past Medical History:  Diagnosis Date  . Basal cell carcinoma   . Esophageal mass 01/20/2018   From PET scan order  . Hyperlipidemia   . Hypertension     Patient Active Problem List   Diagnosis Date Noted  . Acute CVA (cerebrovascular accident) ([place]) 02/07/2018  . Severe protein-calorie malnutrition ([place]) 01/30/2018  . Esophageal cancer, stage IV ([place]) 01/30/2018  . Goals of care, counseling/discussion 01/29/2018  . Hypertension 01/25/2018  . Esophageal mass 01/25/2018  . Hyperglycemia, unspecified 11/07/2016  . Health care maintenance 10/24/2015  . Erectile dysfunction due to arterial insufficiency 06/21/2015  . History of nonmelanoma skin cancer 02/18/2012  . Dyslipidemia 05/13/2011  . Tobacco use 05/13/2011    Past Surgical History:  Procedure Laterality Date  . COLONOSCOPY WITH PROPOFOL N/A 12/27/2015   Procedure: COLONOSCOPY WITH PROPOFOL;  Surgeon: Manya Silvas, MD;  Location: [facility] ENDOSCOPY;  Service: Endoscopy;  Laterality: N/A;  . ESOPHAGOGASTRODUODENOSCOPY (EGD) WITH PROPOFOL N/A 01/19/2018   Procedure:  ESOPHAGOGASTRODUODENOSCOPY (EGD) WITH PROPOFOL;  Surgeon: Lollie Sails, MD;  Location: [facility] ENDOSCOPY;  Service: Endoscopy;  Laterality: N/A;  . IR GASTROSTOMY TUBE MOD SED  01/29/2018  . MOHS SURGERY    . PORTA CATH INSERTION N/A 02/02/2018   Procedure: PORTA CATH INSERTION;  Surgeon: Katha Cabal, MD;  Location: [place] CV LAB;  Service: Cardiovascular;  Laterality: N/A;    Prior to Admission medications   Medication Sig Start Date End Date Taking? Authorizing Provider  Nutritional Supplements (FEEDING SUPPLEMENT, OSMOLITE 1.5 CAL,) LIQD Give 1 carton of osmolite 1.5, 5 times per day via feeding tube.  Flush with 17ml of water before and after feeding osmolite.  Start with 1/2 carton of tube feeding 5 times per day.  Add 1/2 carton of tube feeding daily until goal rate reached. 02/01/18  Yes Earlie Server, MD  ondansetron (ZOFRAN ODT) 8 MG disintegrating tablet Take 1 tablet (8 mg total) by mouth every 8 (eight) hours as needed for nausea or vomiting. 02/05/18  Yes Jacquelin Hawking, NP  prochlorperazine (COMPAZINE) 10 MG tablet Take 1 tablet (10 mg total) by mouth every 6 (six) hours as needed (Nausea or vomiting). 01/30/18  Yes Earlie Server, MD  fentaNYL (DURAGESIC - DOSED MCG/HR) 25 MCG/HR patch Place 1 patch (25 mcg total) onto the skin every 3 (three) days. 01/25/18   Earlie Server, MD    Allergies Patient has no known allergies.  Family History  Problem Relation Age of Onset  . Diabetes Mother     Social History Social History   Tobacco Use  . Smoking status: Current  Every Day Smoker    Packs/day: 0.25    Types: Cigarettes  . Smokeless tobacco: Never Used  Substance Use Topics  . Alcohol use: No  . Drug use: No    Review of Systems Level 5 exemption history limited by the patient's clinical condition ____________________________________________   PHYSICAL EXAM:  VITAL SIGNS: ED Triage Vitals [02/07/18 1044]  Enc Vitals Group     BP      Pulse      Resp      Temp       Temp src      SpO2      Weight 124 lb (56.2 kg)     Height 5\' 8"  (1.727 m)     Head Circumference      Peak Flow      Pain Score      Pain Loc      Pain Edu?      Excl. in [place]?     Constitutional: Anxious appearing although in no acute distress Eyes: PERRL EOMI. Head: Atraumatic. Nose: No congestion/rhinnorhea. Mouth/Throat: No trismus Neck: No stridor.   Cardiovascular: Normal rate, regular rhythm. Grossly normal heart sounds.  Good peripheral circulation. Respiratory: Normal respiratory effort.  No retractions. Lungs CTAB and moving good air Gastrointestinal: Soft nontender Musculoskeletal: No lower extremity edema   Neurologic: The patient is aphasic.  Has right-sided neglect.  Is able to move all 4 extremities. Skin:  Skin is warm, dry and intact. No rash noted. Psychiatric: Anxious appearing    ____________________________________________   DIFFERENTIAL includes but not limited to  Large vessel occlusion, ischemic stroke, hemorrhagic stroke, brain metastases ____________________________________________   LABS (all labs ordered are listed, but only abnormal results are displayed)  Labs Reviewed  DIFFERENTIAL - Abnormal; Notable for the following components:      Result Value   Neutro Abs 7.6 (*)    Lymphs Abs 0.9 (*)    All other components within normal limits  COMPREHENSIVE METABOLIC PANEL - Abnormal; Notable for the following components:   Sodium 133 (*)    Potassium 3.4 (*)    Chloride 94 (*)    Glucose, Bld 128 (*)    BUN 33 (*)    AST 54 (*)    Alkaline Phosphatase 210 (*)    Total Bilirubin 1.5 (*)    All other components within normal limits  TROPONIN I - Abnormal; Notable for the following components:   Troponin I 0.65 (*)    All other components within normal limits  URINALYSIS, ROUTINE W REFLEX MICROSCOPIC - Abnormal; Notable for the following components:   Color, Urine YELLOW (*)    APPearance CLEAR (*)    Specific Gravity, Urine >1.046 (*)     Glucose, UA 50 (*)    Ketones, ur 5 (*)    All other components within normal limits  GLUCOSE, CAPILLARY - Abnormal; Notable for the following components:   Glucose-Capillary 121 (*)    All other components within normal limits  BASIC METABOLIC PANEL - Abnormal; Notable for the following components:   Sodium 132 (*)    Potassium 3.3 (*)    Chloride 100 (*)    Glucose, Bld 135 (*)    BUN 23 (*)    Calcium 7.7 (*)    All other components within normal limits  PROTIME-INR  APTT  CBC  ETHANOL  MAGNESIUM  CBG MONITORING, ED    Lab work reviewed by me with elevated troponin which is likely secondary to demand  __________________________________________  EKG  ED ECG REPORT I, Darel Hong, the attending physician, personally viewed and interpreted this ECG.  Date: 02/17/2018 EKG Time:  Rate: 110 Rhythm: Sinus tachycardia QRS Axis: normal Intervals: Prolonged QTC ST/T Wave abnormalities: normal Narrative Interpretation: no evidence of acute ischemia.  Multiple premature ventricular contractions  ____________________________________________  RADIOLOGY  Head CT reviewed by me suggestive of left MCA stroke ____________________________________________   PROCEDURES  Procedure(s) performed: no  .Critical Care Performed by: Darel Hong, MD Authorized by: Darel Hong, MD   Critical care provider statement:    Critical care time (minutes):  40   Critical care time was exclusive of:  Separately billable procedures and treating other patients   Critical care was necessary to treat or prevent imminent or life-threatening deterioration of the following conditions:  CNS failure or compromise   Critical care was time spent personally by me on the following activities:  Development of treatment plan with patient or surrogate, discussions with consultants, evaluation of patient's response to treatment, examination of patient, obtaining history from patient or surrogate,  ordering and performing treatments and interventions, ordering and review of laboratory studies, ordering and review of radiographic studies, pulse oximetry, re-evaluation of patient's condition and review of old charts    Critical Care performed: Yes  Observation: no ____________________________________________   INITIAL IMPRESSION / ASSESSMENT AND PLAN / ED COURSE  Pertinent labs & imaging results that were available during my care of the patient were reviewed by me and considered in my medical decision making (see chart for details).       ----------------------------------------- 11:03 AM on 02/07/2018 -----------------------------------------  By the time I was notified of the patient he had already had a plain CT scan of his head.  He appears to have right-sided neglect and aphasia so while it is around 12 hours since his last well-known time I have high clinical suspicion for large vessel occlusion and the patient is still well within the 24-hour window for treatment.  Sending him back to CT angios now. ____________________________________________   ----------------------------------------- 11:16 AM on 02/07/2018 -----------------------------------------  I was called by radiology that the patient CT scan is concerning for an acute left MCA infarct.  Patient is being evaluated by telemetry neurology now and I have updated them and notified CT scan that the patient needs to go for emergent CT angiogram.    ----------------------------------------- 12:33 PM on 02/07/2018 -----------------------------------------  I discussed with the stroke neurologist Dr. Thressa Sheller who indicated the patient is neither a TPA nor an IR candidate.  He will be admitted to our hospital for further stroke rehabilitation.  The patient and family understand and agree with the plan.  FINAL CLINICAL IMPRESSION(S) / ED DIAGNOSES  Final diagnoses:  Acute ischemic left MCA stroke ([place])       NEW MEDICATIONS STARTED DURING THIS VISIT:  Discharge Medication List as of 02/10/2018  1:51 PM       Note:  This document was prepared using Dragon voice recognition software and may include unintentional dictation errors.     Darel Hong, MD 02/10/18 7017    Darel Hong, MD 02/17/18 2203

## 2018-02-07 NOTE — ED Notes (Signed)
Per Dr.Chen, cancel MRI and [place]. Both notified already

## 2018-02-07 NOTE — Progress Notes (Signed)
   02/07/18 1051  Clinical Encounter Type  Visited With Patient and family together  Visit Type Code   Chaplain responded to [facility] stroke page.  Two care team members working with patient; woman present in room with patient.  Chaplain offered silent prayer and then introduced self; woman expressed preference for chaplain to check back later.

## 2018-02-07 NOTE — ED Notes (Signed)
Patient transported to CT with RN 

## 2018-02-07 NOTE — ED Notes (Signed)
Returned from CT.

## 2018-02-08 ENCOUNTER — Ambulatory Visit: Payer: Medicare HMO

## 2018-02-08 ENCOUNTER — Telehealth: Payer: Self-pay | Admitting: *Deleted

## 2018-02-08 ENCOUNTER — Inpatient Hospital Stay: Payer: Medicare HMO

## 2018-02-08 MED ORDER — OSMOLITE 1.5 CAL PO LIQD
237.0000 mL | Freq: Every day | ORAL | Status: DC
  Administered 2018-02-08: 14:00:00 237 mL

## 2018-02-08 MED ORDER — OSMOLITE 1.5 CAL PO LIQD
237.0000 mL | Freq: Every day | ORAL | Status: DC
  Administered 2018-02-08 – 2018-02-10 (×8): 237 mL

## 2018-02-08 MED ORDER — ROSUVASTATIN CALCIUM 20 MG PO TABS: 20.0000 mg | ORAL_TABLET | Freq: Every day | ORAL | Status: DC

## 2018-02-08 MED ORDER — FREE WATER
180.0000 mL | Freq: Every day | Status: DC
  Administered 2018-02-08 – 2018-02-10 (×8): 180 mL

## 2018-02-08 MED ORDER — OSMOLITE 1.5 CAL PO LIQD
237.0000 mL | Freq: Every day | ORAL | Status: DC
  Administered 2018-02-08: 237 mL

## 2018-02-08 MED ORDER — ROSUVASTATIN CALCIUM 20 MG PO TABS
20.0000 mg | ORAL_TABLET | Freq: Every day | ORAL | Status: DC
  Administered 2018-02-08 – 2018-02-09 (×2): 20 mg
  Filled 2018-02-08 (×2): qty 1

## 2018-02-08 MED ORDER — SODIUM CHLORIDE 0.9% FLUSH: 10.0000 mL | INTRAVENOUS | Status: DC | PRN

## 2018-02-08 NOTE — Telephone Encounter (Signed)
Evan Mason called to report that patient is in hospital s/p "stroke" and wanted all appts cancelled as he is not going to take any more treatments. They are considering Hospice care.  Kim in radiation therapy was notified of this as well by myself.

## 2018-02-08 NOTE — Progress Notes (Signed)
Initial Nutrition Assessment  DOCUMENTATION CODES:   Severe malnutrition in context of chronic illness  INTERVENTION:  Continue Osmolite 1.5 Cal, 5 cans daily via G-tube. Provides 1775 kcal, 75 grams of protein, 905 mL H2O daily.  Will increase free water flush to 90 mL before and after each bolus tube feeding. Provides a total of 1805 ml H2O daily. Discussed with family.  NUTRITION DIAGNOSIS:   Severe Malnutrition related to chronic illness(stage IV esophageal cancer) as evidenced by severe fat depletion, severe muscle depletion.  GOAL:   Patient will meet greater than or equal to 90% of their needs  MONITOR:   PO intake, Labs, Weight trends, TF tolerance, I & O's  REASON FOR ASSESSMENT:   Malnutrition Screening Tool, Consult Assessment of nutrition requirement/status  ASSESSMENT:   72 year old male with PMHx of HTN, HLD, stage IV esophageal cancer on palliative chemotherapy with plans for palliative XRT s/p G-tube placement on 01/29/2018 by IR admitted with acute aphasia secondary to acute left MCA CVA, but family does not want MRI of brain, echocardiograph, or carotid duplex.   -Pending PMT consult.  Discussed patient and nutrition plan of care with outpatient RD at cancer center this AM.  Met with patient at bedside initially as no family members were in room. He was only able to nod to some yes or no questions. He nodded that he was tolerating tube feeds well and had gotten up to 5 cans/day at home. Later when walking by room family were available. They also confirmed that he has been tolerating Osmolite 1.5 Cal 5 cans daily + FWF 60 mL before and after each bolus feeding. Discussed with them plan to increase FWF some since he will now only be taking small sips of fluid by mouth.  Patient was unable to provide weight history to RD today. Per chart he has previously reported losing around 40 lbs since December 2018. Unable to trend weight in chart due to limited  weights.  Access: 16 Fr. Entuit tube with 5-7 mL balloon volume placed on 01/29/2018 by IR; assessed tube and it appears to have the regular connection and not the EnFit connection that also is available with Entuit tubes  Medications reviewed and include: NS @ 75 mL/hr.  Labs reviewed: CBG 121, Sodium 133, Potassium 3.4, Chloride 94, BUN 33. On 4/12 Phosphorus was 5.2 and Magnesium 2.  Discussed with RN. Also discussed with SLP - clear liquids are for pleasure sips and patient will likely be taking in small amounts. Discussed with MD - okay to increase free water flush a little to 90 mL before and after each bolus for total of around 1.8 L/day.  NUTRITION - FOCUSED PHYSICAL EXAM:    Most Recent Value  Orbital Region  Severe depletion  Upper Arm Region  Severe depletion  Thoracic and Lumbar Region  Severe depletion  Buccal Region  Severe depletion  Temple Region  Severe depletion  Clavicle Bone Region  Severe depletion  Clavicle and Acromion Bone Region  Severe depletion  Scapular Bone Region  Severe depletion  Dorsal Hand  Severe depletion  Patellar Region  Severe depletion  Anterior Thigh Region  Severe depletion  Posterior Calf Region  Severe depletion  Edema (RD Assessment)  None  Hair  Reviewed  Eyes  Unable to assess  Mouth  Unable to assess  Skin  Reviewed [skin tenting present,  ecchymosis]  Nails  Reviewed     Diet Order:  Fall precautions Diet clear liquid Room service  appropriate? Yes; Fluid consistency: Thin  EDUCATION NEEDS:   Education needs have been addressed  Skin:  Skin Assessment: Reviewed RN Assessment(ecchymosis to bilateral arms)  Last BM:  02/07/2018 - large type 5  Height:   Ht Readings from Last 1 Encounters:  02/07/18 5' 8"  (1.727 m)    Weight:   Wt Readings from Last 1 Encounters:  02/07/18 124 lb (56.2 kg)    Ideal Body Weight:  70 kg  BMI:  Body mass index is 18.85 kg/m.  Estimated Nutritional Needs:   Kcal:  7654-6503 (MSJ x  1.3-1.5)  Protein:  85-110 grams (1.5-2 grams/kg)  Fluid:  1.7-2 L/day (30-35 mL/kg)  Willey Blade, MS, RD, LDN Office: [phone] Pager: [phone] After Hours/Weekend Pager: [phone]

## 2018-02-08 NOTE — Progress Notes (Signed)
[facility] [place] NAME: Thadd Apuzzo    MR#:  [id number]  DATE OF BIRTH:  1946/06/05  SUBJECTIVE:  CHIEF COMPLAINT:   Chief Complaint  Patient presents with  . Aphasia    REVIEW OF SYSTEMS:   ROS CONSTITUTIONAL: No fever, fatigue or weakness.  EYES: No blurred or double vision.  EARS, NOSE, AND THROAT: No tinnitus or ear pain.  RESPIRATORY: No cough, shortness of breath, wheezing or hemoptysis.  CARDIOVASCULAR: No chest pain, orthopnea, edema.  GASTROINTESTINAL: No nausea, vomiting, diarrhea or abdominal pain.  GENITOURINARY: No dysuria, hematuria.  ENDOCRINE: No polyuria, nocturia,  HEMATOLOGY: No anemia, easy bruising or bleeding SKIN: No rash or lesion. MUSCULOSKELETAL: No joint pain or arthritis.   NEUROLOGIC: No tingling, numbness, weakness.  PSYCHIATRY: No anxiety or depression.   DRUG ALLERGIES:  No Known Allergies  VITALS:  Blood pressure 123/76, pulse 72, temperature 98.2 F (36.8 C), temperature source Oral, resp. rate 18, height 5\' 8"  (1.727 m), weight 56.2 kg (124 lb), SpO2 90 %.  PHYSICAL EXAMINATION:  GENERAL:  72 y.o.-year-old patient lying in the bed with no acute distress.  EYES: Pupils equal, round, reactive to light and accommodation. No scleral icterus. Extraocular muscles intact.  HEENT: Head atraumatic, normocephalic. Oropharynx and nasopharynx clear.  NECK:  Supple, no jugular venous distention. No thyroid enlargement, no tenderness.  LUNGS: Normal breath sounds bilaterally, no wheezing, rales,rhonchi or crepitation. No use of accessory muscles of respiration.  CARDIOVASCULAR: S1, S2 normal. No murmurs, rubs, or gallops.  ABDOMEN: Soft, nontender, nondistended. Bowel sounds present. No organomegaly or mass.  EXTREMITIES: No pedal edema, cyanosis, or clubbing.  NEUROLOGIC: Cranial nerves II through XII are intact. Muscle strength 5/5 in all extremities. Sensation intact. Gait not checked.   PSYCHIATRIC: The patient is alert and oriented x 3.  SKIN: No obvious rash, lesion, or ulcer.    LABORATORY PANEL:   CBC Recent Labs  Lab 02/07/18 1108  WBC 8.7  HGB 13.9  HCT 40.1  PLT 342   ------------------------------------------------------------------------------------------------------------------  Chemistries  Recent Labs  Lab 02/05/18 1521 02/07/18 1108  NA 132* 133*  K 4.1 3.4*  CL 94* 94*  CO2 25 30  GLUCOSE 102* 128*  BUN 32* 33*  CREATININE 1.03 1.01  CALCIUM 9.5 9.0  MG 2.0  --   AST 57* 54*  ALT 43 41  ALKPHOS 209* 210*  BILITOT 0.7 1.5*   ------------------------------------------------------------------------------------------------------------------  Cardiac Enzymes Recent Labs  Lab 02/07/18 1108  TROPONINI 0.65*   ------------------------------------------------------------------------------------------------------------------  RADIOLOGY:  Ct Angio Head W Or Wo Contrast  Result Date: 02/07/2018 CLINICAL DATA:  72 year old male with early cytotoxic edema in the left MCA territory on noncontrast head CT today performed for new aphasia and right side symptoms. EXAM: CT ANGIOGRAPHY HEAD AND NECK CT PERFUSION BRAIN TECHNIQUE: Multidetector CT imaging of the head and neck was performed using the standard protocol during bolus administration of intravenous contrast. Multiplanar CT image reconstructions and MIPs were obtained to evaluate the vascular anatomy. Carotid stenosis measurements (when applicable) are obtained utilizing NASCET criteria, using the distal internal carotid diameter as the denominator. Multiphase CT imaging of the brain was performed following IV bolus contrast injection. Subsequent parametric perfusion maps were calculated using RAPID software. CONTRAST:  Total of 115 milliliters Omnipaque 350 COMPARISON:  Head CT without contrast 1048 hours today. FINDINGS: CT Brain Perfusion Findings: ASPECTS 7 at 1048 hours today. CBF (<30%)  Volume: 12 milliliters, which appears to overlap  the cytotoxic edema detected by plain head CT about the left insula and frontal operculum. Perfusion (Tmax>6.0s) volume: 19 milliliters Mismatch Volume: 7 milliliters Infarction Location:Left MCA. CTA NECK Skeleton: Osteopenia. Mild motion artifact. No acute osseous abnormality identified. Upper chest: Upper lung motion artifact, the lungs appear clear. No superior mediastinal lymphadenopathy identified. Other neck: Mild motion artifact. No neck mass or lymphadenopathy identified. Aortic arch: Calcified aortic atherosclerosis. Three vessel arch configuration with mild to moderate motion artifact at the arch and thoracic inlet. Right carotid system: Motion artifact affecting the right CCA in the upper chest. In the neck the right CCA is patent. There is calcified and soft plaque at the medial right ICA origin and bulb resulting in less than 50 % stenosis with respect to the distal vessel. Mildly tortuous right ICA below the skull base. Left carotid system: Motion artifact affecting the left CCA in the upper chest. In the neck the left CCA is patent with minimal to mild soft and calcified plaque at the left ICA origin. No cervical left ICA stenosis. Vertebral arteries: Motion artifact affecting the proximal right subclavian artery. The right vertebral artery origin is patent and appears grossly normal. The right vertebral artery is dominant and patent to the skull base. There is mild right V3 segment calcified plaque without significant stenosis. Motion artifact affecting the proximal left subclavian artery in the upper chest. The left vertebral artery origin is patent but otherwise not well evaluated. The left vertebral artery is non dominant with intermittent V2 segment calcified plaque. The left vertebral remains patent to the skull base. CTA HEAD Posterior circulation: Dominant distal right vertebral artery. Bilateral V4 segment calcified plaque but only mild left  V4 segment stenosis. Patent vertebrobasilar junction. The a ICAs appear dominant and patent. Patent basilar artery without stenosis. The SCA and PCA origins are patent. Both P1 segments are mildly tortuous. Posterior communicating arteries are diminutive or absent. Bilateral PCA branches are within normal limits. Anterior circulation: Bilateral ICA siphon calcified plaque. Both siphons remain patent. Mild bilateral siphon stenosis. Normal ophthalmic artery origins. Patent carotid termini. Patent MCA and ACA origins with mild irregularity. Mild bilateral MCA origins stenosis. The left A2 scratched at the proximal left A2 segment is mildly ectatic. The anterior communicating artery is diminutive or absent. Bilateral ACA branches otherwise appear normal. The right MCA M1 segment, bifurcation, and right MCA branches are within normal limits. The left MCA M1 segment is patent and bifurcates early. There is no proximal M2 branch occlusion or stenosis. There is irregularity at a a left MCA branch bifurcation affecting M3 origins on series 13, image 152. It is possible there is a small M3 branch occlusion here, or more distally. Venous sinuses: Patent. Anatomic variants: Dominant right vertebral artery. Delayed phase: Stable gray-white matter differentiation throughout the brain. Patchy cytotoxic edema at the left insula and frontal operculum. No abnormal enhancement identified. Review of the MIP images confirms the above findings IMPRESSION: 1. Negative for emergent large vessel occlusion. There distal Left MCA (image 3) branch irregularity and possible occlusion. 2. CT Perfusion detects associated small Left MCA middle division core infarct with a volume of 12 mL, corresponding to the cytotoxic edema on plain head CT today, with little additional penumbra (mismatch volume of only 7 mL). 3. Stable CT appearance of the brain since 1048 hours, no mass effect or hemorrhage. 4. Positive intra- and extracranial atherosclerosis,  although no other hemodynamically significant stenosis is identified in the head and neck. 5. Motion artifact in the  upper chest and at the thoracic inlet limiting evaluation of the aortic arch and proximal great vessels. Electronically Signed   By: Genevie Ann M.D.   On: 02/07/2018 12:16   Ct Angio Neck W And/or Wo Contrast  Result Date: 02/07/2018 CLINICAL DATA:  73 year old male with early cytotoxic edema in the left MCA territory on noncontrast head CT today performed for new aphasia and right side symptoms. EXAM: CT ANGIOGRAPHY HEAD AND NECK CT PERFUSION BRAIN TECHNIQUE: Multidetector CT imaging of the head and neck was performed using the standard protocol during bolus administration of intravenous contrast. Multiplanar CT image reconstructions and MIPs were obtained to evaluate the vascular anatomy. Carotid stenosis measurements (when applicable) are obtained utilizing NASCET criteria, using the distal internal carotid diameter as the denominator. Multiphase CT imaging of the brain was performed following IV bolus contrast injection. Subsequent parametric perfusion maps were calculated using RAPID software. CONTRAST:  Total of 115 milliliters Omnipaque 350 COMPARISON:  Head CT without contrast 1048 hours today. FINDINGS: CT Brain Perfusion Findings: ASPECTS 7 at 1048 hours today. CBF (<30%) Volume: 12 milliliters, which appears to overlap the cytotoxic edema detected by plain head CT about the left insula and frontal operculum. Perfusion (Tmax>6.0s) volume: 19 milliliters Mismatch Volume: 7 milliliters Infarction Location:Left MCA. CTA NECK Skeleton: Osteopenia. Mild motion artifact. No acute osseous abnormality identified. Upper chest: Upper lung motion artifact, the lungs appear clear. No superior mediastinal lymphadenopathy identified. Other neck: Mild motion artifact. No neck mass or lymphadenopathy identified. Aortic arch: Calcified aortic atherosclerosis. Three vessel arch configuration with mild to  moderate motion artifact at the arch and thoracic inlet. Right carotid system: Motion artifact affecting the right CCA in the upper chest. In the neck the right CCA is patent. There is calcified and soft plaque at the medial right ICA origin and bulb resulting in less than 50 % stenosis with respect to the distal vessel. Mildly tortuous right ICA below the skull base. Left carotid system: Motion artifact affecting the left CCA in the upper chest. In the neck the left CCA is patent with minimal to mild soft and calcified plaque at the left ICA origin. No cervical left ICA stenosis. Vertebral arteries: Motion artifact affecting the proximal right subclavian artery. The right vertebral artery origin is patent and appears grossly normal. The right vertebral artery is dominant and patent to the skull base. There is mild right V3 segment calcified plaque without significant stenosis. Motion artifact affecting the proximal left subclavian artery in the upper chest. The left vertebral artery origin is patent but otherwise not well evaluated. The left vertebral artery is non dominant with intermittent V2 segment calcified plaque. The left vertebral remains patent to the skull base. CTA HEAD Posterior circulation: Dominant distal right vertebral artery. Bilateral V4 segment calcified plaque but only mild left V4 segment stenosis. Patent vertebrobasilar junction. The a ICAs appear dominant and patent. Patent basilar artery without stenosis. The SCA and PCA origins are patent. Both P1 segments are mildly tortuous. Posterior communicating arteries are diminutive or absent. Bilateral PCA branches are within normal limits. Anterior circulation: Bilateral ICA siphon calcified plaque. Both siphons remain patent. Mild bilateral siphon stenosis. Normal ophthalmic artery origins. Patent carotid termini. Patent MCA and ACA origins with mild irregularity. Mild bilateral MCA origins stenosis. The left A2 scratched at the proximal left A2  segment is mildly ectatic. The anterior communicating artery is diminutive or absent. Bilateral ACA branches otherwise appear normal. The right MCA M1 segment, bifurcation, and right MCA branches  are within normal limits. The left MCA M1 segment is patent and bifurcates early. There is no proximal M2 branch occlusion or stenosis. There is irregularity at a a left MCA branch bifurcation affecting M3 origins on series 13, image 152. It is possible there is a small M3 branch occlusion here, or more distally. Venous sinuses: Patent. Anatomic variants: Dominant right vertebral artery. Delayed phase: Stable gray-white matter differentiation throughout the brain. Patchy cytotoxic edema at the left insula and frontal operculum. No abnormal enhancement identified. Review of the MIP images confirms the above findings IMPRESSION: 1. Negative for emergent large vessel occlusion. There distal Left MCA (image 3) branch irregularity and possible occlusion. 2. CT Perfusion detects associated small Left MCA middle division core infarct with a volume of 12 mL, corresponding to the cytotoxic edema on plain head CT today, with little additional penumbra (mismatch volume of only 7 mL). 3. Stable CT appearance of the brain since 1048 hours, no mass effect or hemorrhage. 4. Positive intra- and extracranial atherosclerosis, although no other hemodynamically significant stenosis is identified in the head and neck. 5. Motion artifact in the upper chest and at the thoracic inlet limiting evaluation of the aortic arch and proximal great vessels. Electronically Signed   By: Genevie Ann M.D.   On: 02/07/2018 12:16   Ct Cerebral Perfusion W Contrast  Result Date: 02/07/2018 CLINICAL DATA:  72 year old male with early cytotoxic edema in the left MCA territory on noncontrast head CT today performed for new aphasia and right side symptoms. EXAM: CT ANGIOGRAPHY HEAD AND NECK CT PERFUSION BRAIN TECHNIQUE: Multidetector CT imaging of the head and neck  was performed using the standard protocol during bolus administration of intravenous contrast. Multiplanar CT image reconstructions and MIPs were obtained to evaluate the vascular anatomy. Carotid stenosis measurements (when applicable) are obtained utilizing NASCET criteria, using the distal internal carotid diameter as the denominator. Multiphase CT imaging of the brain was performed following IV bolus contrast injection. Subsequent parametric perfusion maps were calculated using RAPID software. CONTRAST:  Total of 115 milliliters Omnipaque 350 COMPARISON:  Head CT without contrast 1048 hours today. FINDINGS: CT Brain Perfusion Findings: ASPECTS 7 at 1048 hours today. CBF (<30%) Volume: 12 milliliters, which appears to overlap the cytotoxic edema detected by plain head CT about the left insula and frontal operculum. Perfusion (Tmax>6.0s) volume: 19 milliliters Mismatch Volume: 7 milliliters Infarction Location:Left MCA. CTA NECK Skeleton: Osteopenia. Mild motion artifact. No acute osseous abnormality identified. Upper chest: Upper lung motion artifact, the lungs appear clear. No superior mediastinal lymphadenopathy identified. Other neck: Mild motion artifact. No neck mass or lymphadenopathy identified. Aortic arch: Calcified aortic atherosclerosis. Three vessel arch configuration with mild to moderate motion artifact at the arch and thoracic inlet. Right carotid system: Motion artifact affecting the right CCA in the upper chest. In the neck the right CCA is patent. There is calcified and soft plaque at the medial right ICA origin and bulb resulting in less than 50 % stenosis with respect to the distal vessel. Mildly tortuous right ICA below the skull base. Left carotid system: Motion artifact affecting the left CCA in the upper chest. In the neck the left CCA is patent with minimal to mild soft and calcified plaque at the left ICA origin. No cervical left ICA stenosis. Vertebral arteries: Motion artifact  affecting the proximal right subclavian artery. The right vertebral artery origin is patent and appears grossly normal. The right vertebral artery is dominant and patent to the skull base. There is  mild right V3 segment calcified plaque without significant stenosis. Motion artifact affecting the proximal left subclavian artery in the upper chest. The left vertebral artery origin is patent but otherwise not well evaluated. The left vertebral artery is non dominant with intermittent V2 segment calcified plaque. The left vertebral remains patent to the skull base. CTA HEAD Posterior circulation: Dominant distal right vertebral artery. Bilateral V4 segment calcified plaque but only mild left V4 segment stenosis. Patent vertebrobasilar junction. The a ICAs appear dominant and patent. Patent basilar artery without stenosis. The SCA and PCA origins are patent. Both P1 segments are mildly tortuous. Posterior communicating arteries are diminutive or absent. Bilateral PCA branches are within normal limits. Anterior circulation: Bilateral ICA siphon calcified plaque. Both siphons remain patent. Mild bilateral siphon stenosis. Normal ophthalmic artery origins. Patent carotid termini. Patent MCA and ACA origins with mild irregularity. Mild bilateral MCA origins stenosis. The left A2 scratched at the proximal left A2 segment is mildly ectatic. The anterior communicating artery is diminutive or absent. Bilateral ACA branches otherwise appear normal. The right MCA M1 segment, bifurcation, and right MCA branches are within normal limits. The left MCA M1 segment is patent and bifurcates early. There is no proximal M2 branch occlusion or stenosis. There is irregularity at a a left MCA branch bifurcation affecting M3 origins on series 13, image 152. It is possible there is a small M3 branch occlusion here, or more distally. Venous sinuses: Patent. Anatomic variants: Dominant right vertebral artery. Delayed phase: Stable gray-white  matter differentiation throughout the brain. Patchy cytotoxic edema at the left insula and frontal operculum. No abnormal enhancement identified. Review of the MIP images confirms the above findings IMPRESSION: 1. Negative for emergent large vessel occlusion. There distal Left MCA (image 3) branch irregularity and possible occlusion. 2. CT Perfusion detects associated small Left MCA middle division core infarct with a volume of 12 mL, corresponding to the cytotoxic edema on plain head CT today, with little additional penumbra (mismatch volume of only 7 mL). 3. Stable CT appearance of the brain since 1048 hours, no mass effect or hemorrhage. 4. Positive intra- and extracranial atherosclerosis, although no other hemodynamically significant stenosis is identified in the head and neck. 5. Motion artifact in the upper chest and at the thoracic inlet limiting evaluation of the aortic arch and proximal great vessels. Electronically Signed   By: Genevie Ann M.D.   On: 02/07/2018 12:16   Ct Head Code Stroke Wo Contrast  Addendum Date: 02/07/2018   ADDENDUM REPORT: 02/07/2018 11:21 ADDENDUM: Study discussed by telephone with Dr. Marjean Donna on 02/07/2018 at 1112 hours. Electronically Signed   By: Genevie Ann M.D.   On: 02/07/2018 11:21   Result Date: 02/07/2018 CLINICAL DATA:  Code stroke. 72 year old male who awoke this morning with aphasia and ruling out of the right side of the mouth. Metastatic esophageal cancer. EXAM: CT HEAD WITHOUT CONTRAST TECHNIQUE: Contiguous axial images were obtained from the base of the skull through the vertex without intravenous contrast. COMPARISON:  Brain MRI 01/29/2018. FINDINGS: Brain: Patchy chronic bilateral white matter hypodensity. However, there is new cortical hypodensity along the mid insula and overlying operculum best seen on coronal image 31. The left basal ganglia and internal capsule appear have normal gray-white matter differentiation. There is subtle cortical hypodensity  suspected elsewhere at the left frontal operculum on series 2, image 21. No associated hemorrhage or mass effect. No cytotoxic edema in the right hemisphere or posterior fossa identified. No ventriculomegaly. No intracranial mass lesion  or mass effect. Vascular: Calcified atherosclerosis at the skull base. Skull: No suspicious intracranial vascular hyperdensity. Sinuses/Orbits: Clear. Other: Leftward gaze deviation. Visualized scalp soft tissues are within normal limits. ASPECTS [facility] Stroke Program Early CT Score) - Ganglionic level infarction (caudate, lentiform nuclei, internal capsule, insula, M1-M3 cortex): 5 (abnormal insula and M1 segment). - Supraganglionic infarction (M4-M6 cortex): 2 (abnormal M 5 segment). Total score (0-10 with 10 being normal): 7 IMPRESSION: 1. Cytotoxic edema in the left insula and frontal operculum compatible with acute/evolving left MCA territory infarct.ASPECTS is 7 out of 10. 2. No associated hemorrhage or intracranial mass effect. 3. Underlying chronic bilateral cerebral white matter disease as seen on the recent MRI. Electronically Signed: By: Genevie Ann M.D. On: 02/07/2018 11:10    EKG:   Orders placed or performed during the hospital encounter of 02/07/18  . ED EKG  . 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 /Echo, carotids.  Patient MRI of the brain in April 1 week showed no metastasis. #2 stage IV esophageal cancer, status post PEG, restart PEG tube feeding, patient seen by Barnetta Chapel from speech therapy, can have pleasure food especially only popsicles.  Patient had MBS done recently in March. 3.  Regarding stage IV esophageal cancer patient is getting chemotherapy.  Recently diagnosed cancer.  Patient had weight loss of 40 pounds, EGD on March 26 showed fungating mass at GE junction with  near complete obstruction showing high-grade neoplasia, PET scan showed distal esophageal, stomach metastases, liver metastases, possible lung metastases, patient had PEG placed recently and also port placed.  Patient right now getting PEG tube feeding, restarted them. 4. hypo Natremia, hypokalemia: Supplement 5.  severeMalnutrition, dietary consult, tube feeding..  All the records are reviewed and case discussed with Care Management/Social Workerr. Management plans discussed with the patient, family and they are in agreement.  CODE STATUS: DNR  TOTAL TIME TAKING CARE OF THIS PATIENT: 25minutes.   POSSIBLE D/C IN 1-2DAYS, DEPENDING ON CLINICAL CONDITION.   Epifanio Lesches M.D on 02/08/2018 at 9:23 AM  Between 7am to 6pm - Pager - [phone]  After 6pm go to [email or web address] - password [email or web address] [facility]  [facility]  Office  [phone]  CC: Primary care physician; Kirk Ruths, MD   Note: This dictation was prepared with Dragon dictation along with smaller phrase technology. Any transcriptional errors that result from this process are unintentional.

## 2018-02-08 NOTE — Progress Notes (Signed)
Spoke to patient's sister at length, no more testing for his stroke.  Continue aspirin, statins via PEG.  Patient want all the chemotherapy at cancer center already is giving.  And he says he wants to continue treatment.  Regarding home health physical therapy patient lives with his fiance and he wants to talk.  Sister told me that patient lives alone during daytime and he does not feel comfortable him living alone.  Palliative care consult called because of metastatic esophageal cancer.  Resume tube feeding.  We will also want to see if he can get hospice services at home.

## 2018-02-08 NOTE — Evaluation (Signed)
Physical Therapy Evaluation Patient Details Name: Evan Mason MRN: [id number] DOB: April 23, 1946 Today's Date: 02/08/2018   History of Present Illness  Patient is a 72 year old male admitted from home for a CVA following c/o R side neglect and aphasia.  PMH includes esophageal mass, Htn, HLD and basal cell CA  Clinical Impression  Pt is a 72 year old male who lives in a one story home with his significant other.  Pt is independent in mobility at baseline and receives assistance with driving and obtaining groceries.  He has just completed treatment for stage IV esophageal CA  In the last week, according to his daughter.  Pt is in bed upon PT arrival and able to communicate with gestures but unable to speak.  The daughter states that onset is consistent with the time of the stroke and not related to the pt's CA.  Pt reports no pain.  Strength testing reveals no significant deficits of R UE or LE compared to L. He is able to perform bed mobility independently and STS transfer with supervision. Pt ambulated 50 ft in room with occasional furniture cruising to steady himself.  PT discussed use of SPC for distance walking for fall prevention.  He did not appear to be limited by visual deficits during ambulation or visual field testing.  Pt will continue to benefit from skilled PT with focus on strength, safe functional mobility, safe use of AD and balance.    Follow Up Recommendations Home health PT    Equipment Recommendations  Cane    Recommendations for Other Services       Precautions / Restrictions Precautions Precautions: Fall Restrictions Weight Bearing Restrictions: No      Mobility  Bed Mobility Overal bed mobility: Independent                Transfers Overall transfer level: Needs assistance Equipment used: None Transfers: Sit to/from Stand Sit to Stand: Supervision         General transfer comment: Pt able to stand with use of bilateral UE and appearance of being  slightly weakened and unsteady on feet.  Ambulation/Gait Ambulation/Gait assistance: Supervision Ambulation Distance (Feet): 50 Feet         General Gait Details: Able to ambulate in room while occasionally "furniture cruising" and demonstrating no LOB's or difficulty clearing obstacles.  PT discussed using a SPC for assistance with fatigue during longer distance walking and any visual deficits that may affect obstacle clearing.  Stairs            Wheelchair Mobility    Modified Rankin (Stroke Patients Only)       Balance Overall balance assessment: Needs assistance;Mild deficits observed, not formally tested Sitting-balance support: Bilateral upper extremity supported;Feet supported       Standing balance support: Single extremity supported   Standing balance comment: Pt uses furniture to occasionally steady himself.  PT discussed use of SPC for fall prevention.                             Pertinent Vitals/Pain Pain Assessment: No/denies pain    Home Living Family/patient expects to be discharged to:: Private residence Living Arrangements: Spouse/significant other Available Help at Discharge: Family;Available PRN/intermittently Type of Home: House Home Access: Stairs to enter Entrance Stairs-Rails: Can reach both Entrance Stairs-Number of Steps: 7 Home Layout: One level Home Equipment: None      Prior Function Level of Independence: Needs assistance  Gait / Transfers Assistance Needed: Pt independent with mobility.  Reports only needing assistance with driving and obtaining groceries.           Hand Dominance   Dominant Hand: Right    Extremity/Trunk Assessment   Upper Extremity Assessment Upper Extremity Assessment: Generalized weakness(No significant strength deficits of R vs L side.  No sensation loss noted.)    Lower Extremity Assessment Lower Extremity Assessment: Generalized weakness(Grossly 4-/5, no significant strength  difference between L & R.)    Cervical / Trunk Assessment Cervical / Trunk Assessment: Kyphotic  Communication   Communication: Expressive difficulties  Cognition Arousal/Alertness: Awake/alert Behavior During Therapy: Restless Overall Cognitive Status: Within Functional Limits for tasks assessed                                 General Comments: Pt able to respond to questions with head nodding and gestures.  Unable to speak.  Daughter answered several questions for pt.        General Comments General comments (skin integrity, edema, etc.): Visual field testing: Pt symmetric bilaterally with no indication of difficulty with peripheral vision.    Exercises     Assessment/Plan    PT Assessment Patient needs continued PT services  PT Problem List Decreased mobility;Decreased strength;Decreased safety awareness;Decreased balance;Decreased knowledge of use of DME;Decreased activity tolerance       PT Treatment Interventions      PT Goals (Current goals can be found in the Care Plan section)  Acute Rehab PT Goals Patient Stated Goal: To return home as soon as possible. PT Goal Formulation: With patient/family Time For Goal Achievement: 02/22/18 Potential to Achieve Goals: Good    Frequency Min 2X/week   Barriers to discharge        Co-evaluation               AM-PAC PT "6 Clicks" Daily Activity  Outcome Measure Difficulty turning over in bed (including adjusting bedclothes, sheets and blankets)?: None Difficulty moving from lying on back to sitting on the side of the bed? : None Difficulty sitting down on and standing up from a chair with arms (e.g., wheelchair, bedside commode, etc,.)?: A Little Help needed moving to and from a bed to chair (including a wheelchair)?: A Little Help needed walking in hospital room?: A Little Help needed climbing 3-5 steps with a railing? : A Little 6 Click Score: 20    End of Session Equipment Utilized During  Treatment: Gait belt Activity Tolerance: Patient tolerated treatment well Patient left: in chair;with chair alarm set;with family/visitor present Nurse Communication: Mobility status;Precautions PT Visit Diagnosis: Unsteadiness on feet (R26.81);Muscle weakness (generalized) (M62.81)    Time: 0086-7619 PT Time Calculation (min) (ACUTE ONLY): 25 min   Charges:   PT Evaluation $PT Eval Low Complexity: 1 Low     PT G Codes:   PT G-Codes **NOT FOR INPATIENT CLASS** Functional Assessment Tool Used: AM-PAC 6 Clicks Basic Mobility    Roxanne Gates, PT, DPT   Roxanne Gates 02/08/2018, 10:25 AM

## 2018-02-08 NOTE — Evaluation (Signed)
Clinical/Bedside Swallow Evaluation Patient Details  Name: Evan Mason MRN: [id number] Date of Birth: July 12, 1946  Today's Date: 02/08/2018 Time: SLP Start Time (ACUTE ONLY): 1015 SLP Stop Time (ACUTE ONLY): 1115 SLP Time Calculation (min) (ACUTE ONLY): 60 min  Past Medical History:  Past Medical History:  Diagnosis Date  . Basal cell carcinoma   . Esophageal mass 01/20/2018   From PET scan order  . Hyperlipidemia   . Hypertension    Past Surgical History:  Past Surgical History:  Procedure Laterality Date  . COLONOSCOPY WITH PROPOFOL N/A 12/27/2015   Procedure: COLONOSCOPY WITH PROPOFOL;  Surgeon: Manya Silvas, MD;  Location: [facility] ENDOSCOPY;  Service: Endoscopy;  Laterality: N/A;  . ESOPHAGOGASTRODUODENOSCOPY (EGD) WITH PROPOFOL N/A 01/19/2018   Procedure: ESOPHAGOGASTRODUODENOSCOPY (EGD) WITH PROPOFOL;  Surgeon: Lollie Sails, MD;  Location: [facility] ENDOSCOPY;  Service: Endoscopy;  Laterality: N/A;  . IR GASTROSTOMY TUBE MOD SED  01/29/2018  . MOHS SURGERY    . PORTA CATH INSERTION N/A 02/02/2018   Procedure: PORTA CATH INSERTION;  Surgeon: Katha Cabal, MD;  Location: [facility] LAB;  Service: Cardiovascular;  Laterality: N/A;   HPI:  Pt is a 72 y.o. 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  He is in stage IV esophageal cancer with metastasis.  He is being admitted for acute CVA with aphasia, neglect on R side.  Patient has severe malnutrition.  On PEG tube feeding.  He has very poor prognosis and is receiving palliative chemotherapy per MD.  Head CT revealed Cytotoxic edema in the left insula and frontal operculum compatible with acute/evolving small, left MCA territory infarct.  Pt was able to say "ahh" upon commands; utilized gestures and head nodding to make wants/needs known.    Assessment / Plan / Recommendation Clinical  Impression  Pt appears to present w/ Mild oropharyngeal phase dysphagia impacted by min decreased oral/lingual strength for coordinated movements for bolus control. Head CT revealed Cytotoxic edema in the left insula and frontal operculum compatible with acute/evolving small, left MCA territory infarct.  Pt was able to say "ahh" upon command. Pt has recent dx of Esophageal Ca w/ obstruction in the distal Esophagus' s/p PEG placement for nutritional support. Pt continues to take small sips of liquids for pleasure. During trials of thin liquids (no solids were assessed d/t GI Ca and obstruction), pt appeared to adequately tolerate small sips but when pt tilted his head back to aid bolus transfer, he coughed following the swallow. No further coughing was noted when pt to small, single sips keeping his head forward. When pt stated "ahh" post the trials, clear vocal quality was noted w/ no decline in respiratory status. Suspect pt's bolus control during the oropharyngeal phases of swallowing is best w/ small, single sips via cup/tsp. Aspiration precautions were given to pt and family present. Pt nodded to communicate he wanted to have "Pleasure Sips" of liquids; family agreed w/ sips for Pleasure and Quality of Life choice. No foods will be given; PEG TFs for nutritional support. ST services will f/u w/ further education on precautions and strategies w/ sips of thin liquids. Precautions posted in room.  SLP Visit Diagnosis: Dysphagia, oropharyngeal phase (R13.12)    Aspiration Risk  Mild aspiration risk    Diet Recommendation  THIN Liquid consistencies (clear liquid diet) per MD order; aspiration precautions; monitoring at meals  Medication Administration: Via alternative means(medications  via PEG)    Other  Recommendations Recommended Consults: (Palliative f/u; Dietician f/u) Oral Care Recommendations: Oral care BID;Staff/trained caregiver to provide oral care Other Recommendations: (n/a at this time)    Follow up Recommendations Home health SLP(TBD)      Frequency and Duration min 3x week  2 weeks       Prognosis Prognosis for Safe Diet Advancement: Fair(-Good) Barriers to Reach Goals: Language deficits      Swallow Study   General Date of Onset: 02/07/18 HPI: Pt is a 72 y.o. 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  He is in stage IV esophageal cancer with metastasis.  He is being admitted for acute CVA with aphasia, neglect on R side.  Patient has severe malnutrition.  On PEG tube feeding.  He has very poor prognosis and is receiving palliative chemotherapy per MD.  Head CT revealed Cytotoxic edema in the left insula and frontal operculum compatible with acute/evolving small, left MCA territory infarct.  Pt was able to say "ahh" upon commands; utilized gestures and head nodding to make wants/needs known.  Type of Study: Bedside Swallow Evaluation Previous Swallow Assessment: none reported Diet Prior to this Study: NPO Temperature Spikes Noted: No(wbc 8.7) Respiratory Status: Room air History of Recent Intubation: No Behavior/Cognition: Alert;Cooperative;Pleasant mood;Distractible;Requires cueing(nonverbal) Oral Cavity Assessment: Within Functional Limits Oral Care Completed by SLP: Recent completion by staff Oral Cavity - Dentition: Missing dentition Vision: Functional for self-feeding Self-Feeding Abilities: Able to feed self;Needs assist;Needs set up Patient Positioning: Upright in bed Baseline Vocal Quality: (nodded, gestures; non verbal but said "ahh") Volitional Cough: Strong Volitional Swallow: Able to elicit    Oral/Motor/Sensory Function Overall Oral Motor/Sensory Function: Mild impairment(min reduced tone and coordinated movements) Facial ROM: Within Functional Limits Facial Symmetry: Within Functional Limits Lingual ROM: Within  Functional Limits Lingual Symmetry: Within Functional Limits Lingual Strength: Reduced Velum: (NT) Mandible: Within Functional Limits   Ice Chips Ice chips: Within functional limits Presentation: Spoon(fed; 3 trials) Other Comments: pt was confused and swallowed ice chip trial x1/3   Thin Liquid Thin Liquid: Impaired(x1/10 trials) Presentation: Cup;Self Fed;Spoon Oral Phase Impairments: (none) Oral Phase Functional Implications: (none) Pharyngeal  Phase Impairments: Cough - Delayed(x1 when pt tilted his head back for swallowing) Other Comments: education given on not tilting head back when swallowing    Nectar Thick Nectar Thick Liquid: Not tested   Honey Thick Honey Thick Liquid: Not tested   Puree Puree: Not tested   Solid   GO   Solid: Not tested Other Comments: pt has Esophageal Ca w/ obstruction in the distal Esophagus         Orinda Kenner, MS, CCC-SLP Watson,Katherine 02/08/2018,1:50 PM

## 2018-02-09 ENCOUNTER — Ambulatory Visit: Payer: Medicare HMO

## 2018-02-09 ENCOUNTER — Encounter: Payer: Self-pay | Admitting: Nurse Practitioner

## 2018-02-09 DIAGNOSIS — C159 Malignant neoplasm of esophagus, unspecified: Secondary | ICD-10-CM

## 2018-02-09 DIAGNOSIS — Z515 Encounter for palliative care: Secondary | ICD-10-CM

## 2018-02-09 LAB — MAGNESIUM: Magnesium: 1.8 mg/dL (ref 1.7–2.4)

## 2018-02-09 LAB — BASIC METABOLIC PANEL
ANION GAP: 6 (ref 5–15)
BUN: 23 mg/dL — ABNORMAL HIGH (ref 6–20)
CALCIUM: 7.7 mg/dL — AB (ref 8.9–10.3)
CO2: 26 mmol/L (ref 22–32)
Chloride: 100 mmol/L — ABNORMAL LOW (ref 101–111)
Creatinine, Ser: 0.63 mg/dL (ref 0.61–1.24)
GFR calc non Af Amer: >60 mL/min (ref >60)
GLUCOSE: 135 mg/dL — AB (ref 65–99)
POTASSIUM: 3.3 mmol/L — AB (ref 3.5–5.1)
Sodium: 132 mmol/L — ABNORMAL LOW (ref 135–145)

## 2018-02-09 MED ORDER — MAGNESIUM SULFATE 2 GM/50ML IV SOLN
2.0000 g | Freq: Once | INTRAVENOUS | Status: AC
  Administered 2018-02-09: 2 g via INTRAVENOUS
  Filled 2018-02-09: qty 50

## 2018-02-09 MED ORDER — POTASSIUM CHLORIDE 20 MEQ/15ML (10%) PO SOLN
40.0000 meq | Freq: Once | ORAL | Status: AC
  Administered 2018-02-09: 40 meq
  Filled 2018-02-09: qty 30

## 2018-02-09 NOTE — Progress Notes (Signed)
[facility] [place] NAME: Evan Mason    MR#:  [id number]  DATE OF BIRTH:  [birth year]  SUBJECTIVE: Patient has aphasia, admitted for CVA.  Discharge to hospice home tomorrow because of history of metastatic esophageal cancer.  CHIEF COMPLAINT:   Chief Complaint  Patient presents with  . Aphasia    REVIEW OF SYSTEMS:   ROS CONSTITUTIONAL: No fever, fatigue or weakness.  EYES: No blurred or double vision.  EARS, NOSE, AND THROAT: No tinnitus or ear pain.  RESPIRATORY: No cough, shortness of breath, wheezing or hemoptysis.  CARDIOVASCULAR: No chest pain, orthopnea, edema.  GASTROINTESTINAL: No nausea, vomiting, diarrhea or abdominal pain.  GENITOURINARY: No dysuria, hematuria.  ENDOCRINE: No polyuria, nocturia,  HEMATOLOGY: No anemia, easy bruising or bleeding SKIN: No rash or lesion. MUSCULOSKELETAL: No joint pain or arthritis.   NEUROLOGIC: No tingling, numbness, weakness.  PSYCHIATRY: No anxiety or depression.   DRUG ALLERGIES:  No Known Allergies  VITALS:  Blood pressure (!) 141/88, pulse 85, temperature 98 F (36.7 C), temperature source Oral, resp. rate 17, height 5\' 8"  (1.727 m), weight 56.2 kg (124 lb), SpO2 98 %.  PHYSICAL EXAMINATION:  GENERAL:  72 y.o.-year-old patient lying in the bed with no acute distress.  EYES: Pupils equal, round, reactive to light and accommodation. No scleral icterus. Extraocular muscles intact.  HEENT: Head atraumatic, normocephalic. Oropharynx and nasopharynx clear.  NECK:  Supple, no jugular venous distention. No thyroid enlargement, no tenderness.  LUNGS: Normal breath sounds bilaterally, no wheezing, rales,rhonchi or crepitation. No use of accessory muscles of respiration.  CARDIOVASCULAR: S1, S2 normal. No murmurs, rubs, or gallops.  ABDOMEN: Soft, nontender, nondistended. Bowel sounds present. No organomegaly or mass.  EXTREMITIES: No pedal edema, cyanosis, or clubbing.   NEUROLOGIC: Cranial nerves II through XII are intact. Muscle strength 5/5 in all extremities. Sensation intact. Gait not checked.  PSYCHIATRIC: The patient is alert and oriented x 3.  SKIN: No obvious rash, lesion, or ulcer.    LABORATORY PANEL:   CBC Recent Labs  Lab 02/07/18 1108  WBC 8.7  HGB 13.9  HCT 40.1  PLT 342   ------------------------------------------------------------------------------------------------------------------  Chemistries  Recent Labs  Lab 02/07/18 1108 02/09/18 0916  NA 133* 132*  K 3.4* 3.3*  CL 94* 100*  CO2 30 26  GLUCOSE 128* 135*  BUN 33* 23*  CREATININE 1.01 0.63  CALCIUM 9.0 7.7*  MG  --  1.8  AST 54*  --   ALT 41  --   ALKPHOS 210*  --   BILITOT 1.5*  --    ------------------------------------------------------------------------------------------------------------------  Cardiac Enzymes Recent Labs  Lab 02/07/18 1108  TROPONINI 0.65*   ------------------------------------------------------------------------------------------------------------------  RADIOLOGY:  No results found.  EKG:   Orders placed or performed during the hospital encounter of 02/07/18  . ED EKG  . ED EKG    ASSESSMENT AND PLAN:  #1 acute aphasia secondary to acute left MCA infarct: Speech therapy consult, continue aspirin, discharge to hospice home tomorrow.  Discussed with patient's daughter ,  Also discussed with palliative care team, hospice liaison Santiago Glad.,  Hypomagnesemia, releases: Replaced. #2 stage IV esophageal cancer, status post PEG, restart PEG tube feeding, patient seen by Barnetta Chapel from speech therapy, can have pleasure food especially only popsicles.  Patient had MBS done recently in March. 3.  Regarding stage IV esophageal cancer patient is getting chemotherapy.  Recently diagnosed cancer.  Patient had weight loss of 40 pounds, EGD on March 26  showed fungating mass at GE junction with near complete obstruction showing high-grade  neoplasia, PET scan showed distal esophageal, stomach metastases, liver metastases, possible lung metastases, patient had PEG placed recently and also port placed.  Patient right now getting PEG tube feeding, restarted them. 4. hypo Natremia, hypokalemia:, Hypomagnesemia, replaced.; 5.  severeMalnutrition, dietary consult, tube feeding..  All the records are reviewed and case discussed with Care Management/Social Workerr. Management plans discussed with the patient, family and they are in agreement.  CODE STATUS: DNR  TOTAL TIME TAKING CARE OF THIS PATIENT: 85minutes.   Possible discharge to hospice home tomorrow. Epifanio Lesches M.D on 02/09/2018 at 7:44 PM  Between 7am to 6pm - Pager - [phone]  After 6pm go to [email or web address] - password [email or web address] [facility]  [facility]  Office  [phone]  CC: Primary care physician; Kirk Ruths, MD   Note: This dictation was prepared with Dragon dictation along with smaller phrase technology. Any transcriptional errors that result from this process are unintentional.

## 2018-02-09 NOTE — Progress Notes (Signed)
New hospice home referral received from [place] following a Palliative Medicine consult. Patient is a 72 year old man with a recent diagnosis of stage IV esophogeal cancer admitted to [facility] on 4/14 for evaluation of right  facial droop and aphasia. He was found to have an acute left MCA infarct. Palliative medicine was consulted for goals of care and met with patient's daughter's and significant other today, they have chosen to focus on comfort with transfer to the hospice home. Writer met in the family room with daughters Colletta Maryland and Earnest Bailey to initiate education regarding hospice services, philosophy and team approach to care with understanding voiced. Much discussion regarding continuation of tube feedings vs pleasure feeding. Emotional support provided. All in agreement with transfer to the hospice home tomorrow. Hospital care team updated. Patient information faxed to referral. Will continue to follow through discharge. Flo Shanks RN, BSN, [facility], hospital liaison [phone]

## 2018-02-09 NOTE — Consult Note (Signed)
Consultation Note Date: 02/09/2018   Patient Name: Evan Mason  DOB: 05-07-1946  MRN: [id number]  Age / Sex: 72 y.o., male  PCP: Kirk Ruths, MD Referring Physician: Epifanio Lesches, MD  Reason for Consultation: Establishing goals of care  HPI/Patient Profile: 72 y.o. male admitted on 02/07/2018 from home with right facial droop and aphasia according to family. 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 He received his first chemotherapy treatment of FOLFOX on 02/03/18 and was scheduled to begin radiation therapy on 02/08/18. He had a PEG tube placed on 01/29/18 for nutritional support. Prior to his cancer diagnosis family reported patient had no prior health conditions. He had a recent MRI of the brain that did not show any brain metastasis.  He has no prior history of a stroke or other vascular risk factors according to his family.  CT head without contrast showed early ischemic changes in the left MCA distribution with.  He was seen by neurology and deemed not to be a TPA candidate. Palliative medicine team consulted for goals of care discussion.  Clinical Assessment and Goals of Care: I have reviewed medical records including lab results, imaging, Epic notes, and MAR, received report from the bedside RN, and assessed the patient. I then met at the bedside along with his significant other Rudi Coco, daughters [place] and Colletta [place] and their spouses to discuss diagnosis prognosis, [place], EOL wishes, disposition and options.  I introduced Palliative Medicine as specialized medical care for people living with serious illness. It focuses on providing relief from the symptoms and stress of a serious illness. The goal is to improve quality of life for both the patient and the family.  We discussed a brief life review of the patient.   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  He has 2 daughter Colletta [place] and [place].  He has been with his significant other, Mardene Celeste for over 15 years.  He loved to be outside working in his yard or doing work in his shed.  Family is most important to him with more emphasis on his daughters. Family reports Mr. Gassett was a very independent man.  As far as functional and nutritional status family reports up until his cancer diagnosis 3 months ago he was independent, ambulatory, and performing all ADLs on his own. Girlfriend reports that she noticed a decline in his health prior to the diagnosis. She felt it was a struggle to get him to come into the hospital to be assessed as he did not like going to see the doctor and had never had any significant medical conditions.  Family verbalized noticing him losing quite a bit of significant weight (almost 50 lbs) and given him the decision to voluntarily come into be seen or to call 911 because they could no longer stand to watch him struggle around the house and become weaker. They expressed their concern of also noticing a more severe decline once  he was diagnosed with cancer. They felt like he became more confused and agitated. Stating after receiving his first chemotherapy treatment he continued to decline and all he would do was sleep and became uninterested in things that was important to him. Daughter stated the day of his initial treatment when she arrived to the house he was confused and stating he couldn't get any clothes on. It took the family over an hour to communicate with him and get him to understand were he was, what was going on, and to help him bath and get dressed.   We discussed his current illness and what it means in the larger context of his on-going co-morbidities.  Natural disease trajectory and expectations at EOL were discussed. Family verbalized understanding that the current  chemotherapy and radiation plan was only palliative with hopes of relieving any symptom burden.  Family was tearful during conversation and all agreed that at this point they felt that the current regimen was causing more harm to him due to side effects. They verbalized their wishes are to allow him to be peaceful and pleasant and spend his last moments of life enjoying them versus dealing with side effects from treatments. They agreed that he is currently in a pleasant space being able to communicate and smile with them this past week and that is how the patient and family would want his transition to be until he is unable to do so.   I attempted to elicit values and goals of care important to the patient.    The difference between aggressive medical intervention and comfort care was considered in light of the patient's goals of care.  At this time patient and family have made a decision not to continue with any aggressive treatments such as chemotherapy or radiation therapy. They would like to continue to treat the treatable while he is hospitalized.   Advanced directives, concepts specific to code status, artifical feeding and hydration, and rehospitalization were considered and discussed. Patient is a DNR/DNI.  He is receiving artificial feeding through his PEG tube at this time.  Family would like to continue with timed trial with continuation of feedings at disposition with awareness that this would only be temporary.   Hospice and Palliative Care services outpatient were explained and offered.  Family verbalized they are unable to care for him in the home.  They are also anxious about dealing with side effects and knowing that he would pass away in the home despite having home hospice services.  Residential hospice facility discussed and family requests that he be placed into a residential hospice facility with 24/7 care allowing them to be by his side. We discussed the goals of hospice and the care  that he would received including aggressive symptom management;  which generally includes discontinuation of tube feedings. Family verbalized understanding.   Questions and concerns were addressed. The family was encouraged to call with questions or concerns.  PMT will continue to support holistically.   [place] (daughter) and Colletta [place] (daughter)   SUMMARY OF RECOMMENDATIONS    DNR/DNI  At family's request we will continue to treat the treatable while hospitalized with the goal of being placed into hospice facility at discharge. Discussed with Dr. Vianne Bulls.   Social worker consult placed for residential hospice.  Agree with attending order for fentanyl patch for pain, Ativan as needed for anxiety, and Zofran as needed for nausea.  Would consider Roxanol for breakthrough pain.   Scopolamine patch and Robinul  for secretions.   Palliative to continue to support patient, family, and medical team during hospitalizations.  DNR/MOST form completed   Code Status/Advance Care Planning:  DNR/DNI at family's request   Palliative Prophylaxis:   Aspiration, Bowel Regimen, Delirium Protocol, Frequent Pain Assessment and Oral Care  Additional Recommendations (Limitations, Scope, Preferences):  Full Scope Treatment family request to continue to treat the treatable while hospitalized.   Psycho-social/Spiritual:   Desire for further Chaplaincy support: NO family Doristine Bosworth coming to visit   Prognosis:   < 2 weeks in the setting or Stage IV metastatic esophageal cancer, discontinuation of chemotherapy/radiation therapy treatments, malnutrition, acute CVA, and immobility.  Discharge Planning: Hospice facility per family's request.      Primary Diagnoses: Present on Admission: . Acute CVA (cerebrovascular accident) ([place])   I have reviewed the medical record, interviewed the patient and family, and examined the patient. The following aspects are pertinent.  Past Medical  History:  Diagnosis Date  . Basal cell carcinoma   . Esophageal mass 01/20/2018   From PET scan order  . Hyperlipidemia   . Hypertension    Social History   Socioeconomic History  . Marital status: Divorced    Spouse name: Not on file  . Number of children: Not on file  . Years of education: Not on file  . Highest education level: Not on file  Occupational History  . Not on file  Social Needs  . Financial resource strain: Not on file  . Food insecurity:    Worry: Not on file    Inability: Not on file  . Transportation needs:    Medical: Not on file    Non-medical: Not on file  Tobacco Use  . Smoking status: Current Every Day Smoker    Packs/day: 0.25    Types: Cigarettes  . Smokeless tobacco: Never Used  Substance and Sexual Activity  . Alcohol use: No  . Drug use: No  . Sexual activity: Not on file  Lifestyle  . Physical activity:    Days per week: Not on file    Minutes per session: Not on file  . Stress: Not on file  Relationships  . Social connections:    Talks on phone: Not on file    Gets together: Not on file    Attends religious service: Not on file    Active member of club or organization: Not on file    Attends meetings of clubs or organizations: Not on file    Relationship status: Not on file  Other Topics Concern  . Not on file  Social History Narrative  . Not on file   Family History  Problem Relation Age of Onset  . Diabetes Mother    Scheduled Meds: . aspirin  325 mg Per Tube Daily  . chlorhexidine  15 mL Mouth Rinse BID  . enoxaparin (LOVENOX) injection  40 mg Subcutaneous q1800  . feeding supplement (OSMOLITE 1.5 CAL)  237 mL Per Tube 5 X Daily  . fentaNYL  25 mcg Transdermal Q72H  . free water  180 mL Per Tube 5 X Daily  . mouth rinse  15 mL Mouth Rinse q12n4p  . potassium chloride  40 mEq Per Tube Once  . rosuvastatin  20 mg Per Tube q1800   Continuous Infusions: . sodium chloride 75 mL/hr at 02/09/18 0545  . magnesium sulfate 1  - 4 g bolus IVPB     PRN Meds:.acetaminophen **OR** acetaminophen (TYLENOL) oral liquid 160 mg/5 mL **OR** acetaminophen,  bisacodyl, LORazepam, ondansetron (ZOFRAN) IV, senna-docusate, sodium chloride flush Medications Prior to Admission:  Prior to Admission medications   Medication Sig Start Date End Date Taking? Authorizing Provider  Nutritional Supplements (FEEDING SUPPLEMENT, OSMOLITE 1.5 CAL,) LIQD Give 1 carton of osmolite 1.5, 5 times per day via feeding tube.  Flush with 41m of water before and after feeding osmolite.  Start with 1/2 carton of tube feeding 5 times per day.  Add 1/2 carton of tube feeding daily until goal rate reached. 02/01/18  Yes YEarlie Server MD  ondansetron (ZOFRAN ODT) 8 MG disintegrating tablet Take 1 tablet (8 mg total) by mouth every 8 (eight) hours as needed for nausea or vomiting. 02/05/18  Yes BJacquelin Hawking NP  prochlorperazine (COMPAZINE) 10 MG tablet Take 1 tablet (10 mg total) by mouth every 6 (six) hours as needed (Nausea or vomiting). 01/30/18  Yes YEarlie Server MD  fentaNYL (DURAGESIC - DOSED MCG/HR) 25 MCG/HR patch Place 1 patch (25 mcg total) onto the skin every 3 (three) days. 01/25/18   YEarlie Server MD  lidocaine-prilocaine (EMLA) cream Apply to affected area once Patient not taking: Reported on 02/07/2018 01/30/18   YEarlie Server MD  ondansetron (ZOFRAN) 8 MG tablet Take 1 tablet (8 mg total) by mouth 2 (two) times daily as needed for refractory nausea / vomiting. Start on day 3 after chemotherapy. Patient not taking: Reported on 02/07/2018 01/30/18   YEarlie Server MD   No Known Allergies Review of Systems  Constitutional: Positive for fatigue.  All other systems reviewed and are negative.   Physical Exam  Constitutional: He is oriented to person, place, and time. He appears cachectic. He appears ill.  Cardiovascular: Normal rate, regular rhythm, normal heart sounds and intact distal pulses.  Pulmonary/Chest: Effort normal. He has decreased breath sounds.  Abdominal:  Soft. Bowel sounds are normal.  PEG tube   Musculoskeletal:  Generalized weakness   Neurological: He is alert and oriented to person, place, and time.  Psychiatric: He has a normal mood and affect.  Able to communicate with mouthing words and pen/paper  Nursing note and vitals reviewed.   Vital Signs: BP (!) 141/88 (BP Location: Right Arm)   Pulse 85   Temp 98 F (36.7 C) (Oral)   Resp 17   Ht 5' 8"  (1.727 m)   Wt 56.2 kg (124 lb)   SpO2 98%   BMI 18.85 kg/m  Pain Scale: PAINAD   Pain Score: 0-No pain   SpO2: SpO2: 98 % O2 Device:SpO2: 98 % O2 Flow Rate: .   IO: Intake/output summary:   Intake/Output Summary (Last 24 hours) at 02/09/2018 1431 Last data filed at 02/08/2018 2240 Gross per 24 hour  Intake 897 ml  Output 400 ml  Net 497 ml    LBM: Last BM Date: 02/07/18 Baseline Weight: Weight: 56.2 kg (124 lb) Most recent weight: Weight: 56.2 kg (124 lb)     Palliative Assessment/Data: PPS 30%   Time In: 0900 Time Out: 1030 Time Total: 90 min. Greater than 50%  of this time was spent counseling and coordinating care related to the above assessment and plan.  Signed by: NAlda Lea NP-BC Palliative Medicine Team  Phone: [phone]: [phone]  Please contact Palliative Medicine Team phone at [phone] questions and concerns.  For individual provider: See AShea Evans

## 2018-02-09 NOTE — Progress Notes (Signed)
Dr Vianne Bulls made aware that CCMD reports pt with trigeminy PVCs, new orders placed

## 2018-02-09 NOTE — Discharge Summary (Signed)
Evan Mason, is a 72 y.o. male  DOB 1946-01-09  MRN [id number].  Admission date:  02/07/2018  Admitting Physician  Demetrios Loll, MD  Discharge Date:  02/09/2018   Primary MD  Kirk Ruths, MD  Recommendations for primary care physician for things to follow:    patient is being discharged to hospice home per  patient and family wishes Admission Diagnosis  Stroke (cerebrum) [facility]) [I63.9] Acute ischemic left MCA stroke [facility]) [I63.512]   Discharge Diagnosis  Stroke (cerebrum) ([place]) [I63.9] Acute ischemic left MCA stroke [facility]) [H37.169]    Active Problems:   Acute CVA (cerebrovascular accident) [facility])      Past Medical History:  Diagnosis Date  . Basal cell carcinoma   . Esophageal mass 01/20/2018   From PET scan order  . Hyperlipidemia   . Hypertension     Past Surgical History:  Procedure Laterality Date  . COLONOSCOPY WITH PROPOFOL N/A 12/27/2015   Procedure: COLONOSCOPY WITH PROPOFOL;  Surgeon: Manya Silvas, MD;  Location: [facility] ENDOSCOPY;  Service: Endoscopy;  Laterality: N/A;  . ESOPHAGOGASTRODUODENOSCOPY (EGD) WITH PROPOFOL N/A 01/19/2018   Procedure: ESOPHAGOGASTRODUODENOSCOPY (EGD) WITH PROPOFOL;  Surgeon: Lollie Sails, MD;  Location: [facility] ENDOSCOPY;  Service: Endoscopy;  Laterality: N/A;  . IR GASTROSTOMY TUBE MOD SED  01/29/2018  . MOHS SURGERY    . 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   Hospital Course  #1. acute left MCA stroke; seen by speech therapy, did not want MRI of the brain, echo, carotids.  Patient family and the patient decided to go to hospice home. 2.  Stage IV esophageal cancer status post PEG, restarted tube feeding, palliative care met with family especially the daughters, they requested that he be transferred to hospice home. 3.  Hypokalemia, hypomagnesemia, PVCs, getting potassium, magnesium replaced.  Likely discharge to hospice home and arrangements are made.  Instructions are in the computer. Prognosis poor CODE STATUS DNR  PEG tube feedings can be continued Use Roxanol and ativan as needed for comfort  Discharge Condition: stable   Follow UP      Discharge Instructions  and  Discharge Medications      Allergies as of 02/09/2018   No Known Allergies     Medication List    STOP taking these medications   lidocaine-prilocaine cream Commonly known as:  EMLA   ondansetron 8 MG tablet Commonly known as:  ZOFRAN     TAKE these medications   feeding supplement (OSMOLITE 1.5 CAL) Liqd Give 1 carton of osmolite 1.5, 5 times per day via feeding tube.  Flush with 34m of water before and after feeding osmolite.  Start with 1/2 carton of tube feeding 5 times per day.  Add 1/2 carton of tube feeding daily until goal rate reached.   fentaNYL 25 MCG/HR patch Commonly known  as:  [place] - dosed mcg/hr Place 1 patch (25 mcg total) onto the skin every 3 (three) days.   ondansetron 8 MG disintegrating tablet Commonly known as:  ZOFRAN ODT Take 1 tablet (8 mg total) by mouth every 8 (eight) hours as needed for nausea or vomiting.   prochlorperazine 10 MG tablet Commonly known as:  COMPAZINE Take 1 tablet (10 mg total) by mouth every 6  (six) hours as needed (Nausea or vomiting).         Diet and Activity recommendation: See Discharge Instructions above   Consults obtained - Speech, palliative care   Major procedures and Radiology Reports - PLEASE review detailed and final reports for all details, in brief -    Ct Angio Head W Or Wo Contrast  Result Date: 02/07/2018 CLINICAL DATA:  72 year old male with early cytotoxic edema in the left MCA territory on noncontrast head CT today performed for new aphasia and right side symptoms. EXAM: CT ANGIOGRAPHY HEAD AND NECK CT PERFUSION BRAIN TECHNIQUE: Multidetector CT imaging of the head and neck was performed using the standard protocol during bolus administration of intravenous contrast. Multiplanar CT image reconstructions and MIPs were obtained to evaluate the vascular anatomy. Carotid stenosis measurements (when applicable) are obtained utilizing NASCET criteria, using the distal internal carotid diameter as the denominator. Multiphase CT imaging of the brain was performed following IV bolus contrast injection. Subsequent parametric perfusion maps were calculated using RAPID software. CONTRAST:  Total of 115 milliliters Omnipaque 350 COMPARISON:  Head CT without contrast 1048 hours today. FINDINGS: CT Brain Perfusion Findings: ASPECTS 7 at 1048 hours today. CBF (<30%) Volume: 12 milliliters, which appears to overlap the cytotoxic edema detected by plain head CT about the left insula and frontal operculum. Perfusion (Tmax>6.0s) volume: 19 milliliters Mismatch Volume: 7 milliliters Infarction Location:Left MCA. CTA NECK Skeleton: Osteopenia. Mild motion artifact. No acute osseous abnormality identified. Upper chest: Upper lung motion artifact, the lungs appear clear. No superior mediastinal lymphadenopathy identified. Other neck: Mild motion artifact. No neck mass or lymphadenopathy identified. Aortic arch: Calcified aortic atherosclerosis. Three vessel arch configuration with mild to  moderate motion artifact at the arch and thoracic inlet. Right carotid system: Motion artifact affecting the right CCA in the upper chest. In the neck the right CCA is patent. There is calcified and soft plaque at the medial right ICA origin and bulb resulting in less than 50 % stenosis with respect to the distal vessel. Mildly tortuous right ICA below the skull base. Left carotid system: Motion artifact affecting the left CCA in the upper chest. In the neck the left CCA is patent with minimal to mild soft and calcified plaque at the left ICA origin. No cervical left ICA stenosis. Vertebral arteries: Motion artifact affecting the proximal right subclavian artery. The right vertebral artery origin is patent and appears grossly normal. The right vertebral artery is dominant and patent to the skull base. There is mild right V3 segment calcified plaque without significant stenosis. Motion artifact affecting the proximal left subclavian artery in the upper chest. The left vertebral artery origin is patent but otherwise not well evaluated. The left vertebral artery is non dominant with intermittent V2 segment calcified plaque. The left vertebral remains patent to the skull base. CTA HEAD Posterior circulation: Dominant distal right vertebral artery. Bilateral V4 segment calcified plaque but only mild left V4 segment stenosis. Patent vertebrobasilar junction. The a ICAs appear dominant and patent. Patent basilar artery without stenosis. The SCA and PCA origins are patent.  Both P1 segments are mildly tortuous. Posterior communicating arteries are diminutive or absent. Bilateral PCA branches are within normal limits. Anterior circulation: Bilateral ICA siphon calcified plaque. Both siphons remain patent. Mild bilateral siphon stenosis. Normal ophthalmic artery origins. Patent carotid termini. Patent MCA and ACA origins with mild irregularity. Mild bilateral MCA origins stenosis. The left A2 scratched at the proximal left A2  segment is mildly ectatic. The anterior communicating artery is diminutive or absent. Bilateral ACA branches otherwise appear normal. The right MCA M1 segment, bifurcation, and right MCA branches are within normal limits. The left MCA M1 segment is patent and bifurcates early. There is no proximal M2 branch occlusion or stenosis. There is irregularity at a a left MCA branch bifurcation affecting M3 origins on series 13, image 152. It is possible there is a small M3 branch occlusion here, or more distally. Venous sinuses: Patent. Anatomic variants: Dominant right vertebral artery. Delayed phase: Stable gray-white matter differentiation throughout the brain. Patchy cytotoxic edema at the left insula and frontal operculum. No abnormal enhancement identified. Review of the MIP images confirms the above findings IMPRESSION: 1. Negative for emergent large vessel occlusion. There distal Left MCA (image 3) branch irregularity and possible occlusion. 2. CT Perfusion detects associated small Left MCA middle division core infarct with a volume of 12 mL, corresponding to the cytotoxic edema on plain head CT today, with little additional penumbra (mismatch volume of only 7 mL). 3. Stable CT appearance of the brain since 1048 hours, no mass effect or hemorrhage. 4. Positive intra- and extracranial atherosclerosis, although no other hemodynamically significant stenosis is identified in the head and neck. 5. Motion artifact in the upper chest and at the thoracic inlet limiting evaluation of the aortic arch and proximal great vessels. Electronically Signed   By: Genevie Ann M.D.   On: 02/07/2018 12:16   Dg Eye Foreign Body  Result Date: 01/29/2018 CLINICAL DATA:  Metal working/exposure; clearance prior to MRI EXAM: ORBITS FOR FOREIGN BODY - 2 VIEW COMPARISON:  None. FINDINGS: There is no evidence of metallic foreign body within the orbits. No significant bone abnormality identified. IMPRESSION: No evidence of metallic foreign body  within the orbits. Electronically Signed   By: Lajean Manes M.D.   On: 01/29/2018 16:28   Ct Angio Neck W And/or Wo Contrast  Result Date: 02/07/2018 CLINICAL DATA:  72 year old male with early cytotoxic edema in the left MCA territory on noncontrast head CT today performed for new aphasia and right side symptoms. EXAM: CT ANGIOGRAPHY HEAD AND NECK CT PERFUSION BRAIN TECHNIQUE: Multidetector CT imaging of the head and neck was performed using the standard protocol during bolus administration of intravenous contrast. Multiplanar CT image reconstructions and MIPs were obtained to evaluate the vascular anatomy. Carotid stenosis measurements (when applicable) are obtained utilizing NASCET criteria, using the distal internal carotid diameter as the denominator. Multiphase CT imaging of the brain was performed following IV bolus contrast injection. Subsequent parametric perfusion maps were calculated using RAPID software. CONTRAST:  Total of 115 milliliters Omnipaque 350 COMPARISON:  Head CT without contrast 1048 hours today. FINDINGS: CT Brain Perfusion Findings: ASPECTS 7 at 1048 hours today. CBF (<30%) Volume: 12 milliliters, which appears to overlap the cytotoxic edema detected by plain head CT about the left insula and frontal operculum. Perfusion (Tmax>6.0s) volume: 19 milliliters Mismatch Volume: 7 milliliters Infarction Location:Left MCA. CTA NECK Skeleton: Osteopenia. Mild motion artifact. No acute osseous abnormality identified. Upper chest: Upper lung motion artifact, the lungs appear clear. No superior  mediastinal lymphadenopathy identified. Other neck: Mild motion artifact. No neck mass or lymphadenopathy identified. Aortic arch: Calcified aortic atherosclerosis. Three vessel arch configuration with mild to moderate motion artifact at the arch and thoracic inlet. Right carotid system: Motion artifact affecting the right CCA in the upper chest. In the neck the right CCA is patent. There is calcified and  soft plaque at the medial right ICA origin and bulb resulting in less than 50 % stenosis with respect to the distal vessel. Mildly tortuous right ICA below the skull base. Left carotid system: Motion artifact affecting the left CCA in the upper chest. In the neck the left CCA is patent with minimal to mild soft and calcified plaque at the left ICA origin. No cervical left ICA stenosis. Vertebral arteries: Motion artifact affecting the proximal right subclavian artery. The right vertebral artery origin is patent and appears grossly normal. The right vertebral artery is dominant and patent to the skull base. There is mild right V3 segment calcified plaque without significant stenosis. Motion artifact affecting the proximal left subclavian artery in the upper chest. The left vertebral artery origin is patent but otherwise not well evaluated. The left vertebral artery is non dominant with intermittent V2 segment calcified plaque. The left vertebral remains patent to the skull base. CTA HEAD Posterior circulation: Dominant distal right vertebral artery. Bilateral V4 segment calcified plaque but only mild left V4 segment stenosis. Patent vertebrobasilar junction. The a ICAs appear dominant and patent. Patent basilar artery without stenosis. The SCA and PCA origins are patent. Both P1 segments are mildly tortuous. Posterior communicating arteries are diminutive or absent. Bilateral PCA branches are within normal limits. Anterior circulation: Bilateral ICA siphon calcified plaque. Both siphons remain patent. Mild bilateral siphon stenosis. Normal ophthalmic artery origins. Patent carotid termini. Patent MCA and ACA origins with mild irregularity. Mild bilateral MCA origins stenosis. The left A2 scratched at the proximal left A2 segment is mildly ectatic. The anterior communicating artery is diminutive or absent. Bilateral ACA branches otherwise appear normal. The right MCA M1 segment, bifurcation, and right MCA branches are  within normal limits. The left MCA M1 segment is patent and bifurcates early. There is no proximal M2 branch occlusion or stenosis. There is irregularity at a a left MCA branch bifurcation affecting M3 origins on series 13, image 152. It is possible there is a small M3 branch occlusion here, or more distally. Venous sinuses: Patent. Anatomic variants: Dominant right vertebral artery. Delayed phase: Stable gray-white matter differentiation throughout the brain. Patchy cytotoxic edema at the left insula and frontal operculum. No abnormal enhancement identified. Review of the MIP images confirms the above findings IMPRESSION: 1. Negative for emergent large vessel occlusion. There distal Left MCA (image 3) branch irregularity and possible occlusion. 2. CT Perfusion detects associated small Left MCA middle division core infarct with a volume of 12 mL, corresponding to the cytotoxic edema on plain head CT today, with little additional penumbra (mismatch volume of only 7 mL). 3. Stable CT appearance of the brain since 1048 hours, no mass effect or hemorrhage. 4. Positive intra- and extracranial atherosclerosis, although no other hemodynamically significant stenosis is identified in the head and neck. 5. Motion artifact in the upper chest and at the thoracic inlet limiting evaluation of the aortic arch and proximal great vessels. Electronically Signed   By: Genevie Ann M.D.   On: 02/07/2018 12:16   Mr Jeri Cos SA Contrast  Result Date: 01/29/2018 CLINICAL DATA:  Metastatic esophageal cancer. Assess for brain  metastases. EXAM: MRI HEAD WITHOUT AND WITH CONTRAST TECHNIQUE: Multiplanar, multiecho pulse sequences of the brain and surrounding structures were obtained without and with intravenous contrast. CONTRAST:  57m MULTIHANCE GADOBENATE DIMEGLUMINE 529 MG/ML IV SOLN COMPARISON:  None. FINDINGS: Brain: Diffusion imaging does not show any acute or subacute infarction. There are extensive chronic small-vessel ischemic changes  of the pons. No focal cerebellar insult. Cerebral hemispheres show moderate chronic small-vessel ischemic changes of the deep and subcortical white matter. No cortical or large vessel territory infarction. No mass lesion, hemorrhage, hydrocephalus or extra-axial collection. No abnormal contrast enhancement. Vascular: Major vessels at the base of the brain show flow. Skull and upper cervical spine: Negative Sinuses/Orbits: Clear/normal Other: None IMPRESSION: No evidence of metastatic disease. Chronic small-vessel ischemic changes of the pons and cerebral hemispheric white matter. No acute insult. Electronically Signed   By: MNelson ChimesM.D.   On: 01/29/2018 19:44   Ir Gastrostomy Tube  Result Date: 01/29/2018 CLINICAL DATA:  Metastatic esophageal carcinoma with high-grade stricture of the distal esophagus and GE junction. Gastrostomy tube needed for additional nutrition. EXAM: PERCUTANEOUS GASTROSTOMY TUBE PLACEMENT ANESTHESIA/SEDATION: 1.0 mg IV Versed; 50 mcg IV Fentanyl. Total Moderate Sedation Time 36 minutes. The patient's level of consciousness and physiologic status were continuously monitored during the procedure by Radiology nursing. CONTRAST:  20 mL Isovue-300 MEDICATIONS: 2 g IV Ancef. IV antibiotic was administered in an appropriate time interval prior to needle puncture of the skin. 0.5 mg IV glucagon was also administered during the procedure. FLUOROSCOPY TIME:  5 minutes and 48 seconds.  76.0 mGy. PROCEDURE: The procedure, risks, benefits, and alternatives were explained to the patient and his significant other who is POA. Questions regarding the procedure were encouraged and answered. Consent was obtained prior to the procedure. A time-out was performed prior to initiating the procedure. A 5-French catheter was then advanced through the patient's mouth under fluoroscopy into the esophagus and to the level of the stomach. This catheter was used to insufflate the stomach with air under fluoroscopy.  The abdominal wall was prepped with chlorhexidine in a sterile fashion, and a sterile drape was applied covering the operative field. A sterile gown and sterile gloves were used for the procedure. Local anesthesia was provided with 1% Lidocaine. A skin incision was made in the upper abdominal wall. Under fluoroscopy, an 18 gauge trocar needle was advanced into the stomach. Contrast injection was performed to confirm intraluminal position of the needle tip. A T tack was then deployed in the lumen of the stomach. This was brought up to tension at the skin surface. A second T tack was then deployed in the stomach utilizing identical procedure spaced approximately 4 cm away from the first T tack. Puncture of the stomach was then performed with a 19 gauge needle in between the T tacks. Contrast injection was performed to confirm intraluminal position. A guidewire was advanced into the gastric lumen. The puncture site was dilated over the wire. An 146French peel-away sheath was then advanced into the stomach. A 16 French balloon retention gastrostomy tube was advanced through the peel-away sheath. The sheath was removed. The retention balloon of the gastrostomy tube was inflated with 7 mL of saline. The catheter was injected with contrast material to confirm position and a fluoroscopic spot image saved. The tube was then flushed with saline. A dressing was applied over the gastrostomy exit site. COMPLICATIONS: None. FINDINGS: The stomach distended well with air allowing safe placement of the gastrostomy tube. After placement,  the tip of the gastrostomy tube lies in the body of the stomach. IMPRESSION: Percutaneous gastrostomy with placement of a 25 French balloon retention tube in the body of the stomach. Electronically Signed   By: Aletta Edouard M.D.   On: 01/29/2018 15:42   Dg Esophagus  Result Date: 01/15/2018 CLINICAL DATA:  Trouble swallowing for 3-4 months. EXAM: ESOPHOGRAM / BARIUM SWALLOW TECHNIQUE: Combined  double contrast and single contrast examination performed using effervescent crystals, thick barium liquid, and thin barium liquid. FLUOROSCOPY TIME:  Fluoroscopy Time:  0.9 minute Radiation Exposure Index (if provided by the fluoroscopic device): 3.7 mGy Number of Acquired Spot Images: 0 COMPARISON:  None. FINDINGS: There was normal pharyngeal anatomy and motility. Contrast flowed freely through the proximal and mid esophagus without evidence of stricture or mass. Distal esophagus just proximal to the gastroesophageal junction demonstrates an irregular stricture measuring 2.5 cm in length. Esophageal motility was normal. No evidence of reflux. No definite hiatal hernia was demonstrated. IMPRESSION: 1. 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 Recommend further evaluation with upper endoscopy. Electronically Signed   By: Kathreen Devoid   On: 01/15/2018 08:34   Nm Pet Image Initial (pi) Skull Base To Thigh  Result Date: 01/20/2018 CLINICAL DATA:  Initial treatment strategy for distal esophageal mass. EXAM: NUCLEAR MEDICINE PET SKULL BASE TO THIGH TECHNIQUE: 6.8 mCi F-18 FDG was injected intravenously. Full-ring PET imaging was performed from the skull base to thigh after the radiotracer. CT data was obtained and used for attenuation correction and anatomic localization. Fasting blood glucose: 88 mg/dl COMPARISON:  Esophagram dated 01/15/2018 FINDINGS: Mediastinal blood pool activity: SUV max 1.9 NECK: No significant abnormal hypermetabolic activity in the neck. Incidental CT findings: none CHEST: In the distal esophagus and extending into the adjacent gastric cardia, a hypermetabolic mass is present with maximum SUV 13.8, corresponding to the area of irregularity on recent esophagram. No appreciable hypermetabolic intrathoracic adenopathy. 0.7 by 0.8 cm right lower lobe pulmonary nodule on image  99/3 demonstrates central cavitation and has visible but very low-grade activity with maximum SUV of 1.1. A nearby empty portion of the right lower lobe has a maximum SUV of 0.4. Incidental CT findings: Coronary, aortic arch, and branch vessel atherosclerotic vascular disease. ABDOMEN/PELVIS: Multiple hypermetabolic masses are present in the liver. An index lesion in the posterior inferior right hepatic lobe has a maximum standard uptake value of 62.3 and has metabolic activity measuring 4.9 by 4.6 cm. At least 10 other masses are present scattered in the liver. I do not see definite abnormal hypermetabolic activity within the porta hepatis. The patient has marked paucity of intra-adipose tissue as well as the extensive retained very high density barium reduced diagnostic sensitivity and specificity by causing severe streak artifact and high density that disrupts the attenuation correction of the PET-CT. I did deliberately evaluate non attenuation corrected images as well in order to help contract the effect of the dense barium and attenuation correction. No additional hypermetabolic intra-abdominal lesions are observed. Incidental CT findings: Images are severely degraded by the streak artifact from the dense barium. Delayed transit time is implied as the patient's barium administration was 5 days ago, and most of this barium should have cleared by now. Aortoiliac atherosclerotic vascular disease. Vascular calcifications in both renal hila. SKELETON: No findings of hypermetabolic skeletal lesion. Focus of activity in the left antecubital fossa is felt to be injection related  and matches the site of injection. Incidental CT findings: Thoracic kyphosis. IMPRESSION: 1. Hypermetabolic mass in the distal esophagus and adjacent stomach, SUV 13.8, compatible with malignancy. There are over 10 scattered hypermetabolic masses in the liver compatible with widespread hepatic metastatic disease. 2. 8 mm notably cavitary right  lower lobe pulmonary nodule is faintly hypermetabolic. Although the maximum SUV is only 1.1, the wall thickness of this lesion is only about 2 mm and accordingly the activity is felt to be abnormal relative to the size of the lesion. This could represent a solitary metastatic lesion to the lung, or a cavitary infectious/inflammatory process. 3. No definite hypermetabolic adenopathy or other metastatic lesions observed. Please note that negative predictive value is adversely affected by the presence of very dense residual barium in the colon (implying delayed clearance of barium from the bowel, given that this barium was administered 5 days ago) and due to the patient's marked paucity of intra-adipose tissue which complicates separation of adjacent structures. In order to help offset the dense barium, I did carefully reviewed the non-attenuation corrected lesions. 4. Other imaging findings of potential clinical significance: Aortic Atherosclerosis (ICD10-I70.0). Coronary atherosclerosis. Thoracic kyphosis. Electronically Signed   By: Van Clines M.D.   On: 01/20/2018 14:18   US Biopsy (liver)  Result Date: 01/26/2018 CLINICAL DATA:  Esophageal mass with multiple liver lesions suspicious for metastatic disease. The patient presents for ultrasound-guided biopsy of 1 of the liver lesions. EXAM: ULTRASOUND GUIDED CORE BIOPSY OF LIVER MEDICATIONS: 2.0 mg IV Versed; 75 mcg IV Fentanyl Total Moderate Sedation Time: 14 minutes. The patient's level of consciousness and physiologic status were continuously monitored during the procedure by Radiology nursing. PROCEDURE: The procedure, risks, benefits, and alternatives were explained to the patient. Questions regarding the procedure were encouraged and answered. The patient understands and consents to the procedure. A time out was performed prior to initiating the procedure. The operative field was prepped with chlorhexidine in a sterile fashion, and a sterile drape  was applied covering the operative field. A sterile gown and sterile gloves were used for the procedure. Local anesthesia was provided with 1% Lidocaine. Multiple lesions were localized in the liver. A lesion in the right lobe of the liver was chosen for biopsy. Under ultrasound guidance a 17 gauge needle was advanced into the lesion. Three separate coaxial 18 gauge core biopsy samples were obtained and submitted in formalin. After the procedure Gel-Foam pledgets were advanced through the outer needle as the needle was retracted. Post biopsy imaging was performed with ultrasound. COMPLICATIONS: None FINDINGS: A large, hyperechoic lesion within the inferior right lobe of the liver measuring approximately 6 cm in maximum diameter was targeted. Solid tissue was obtained. IMPRESSION: Ultrasound-guided core biopsy performed a roughly 6 cm mass within the inferior right lobe of the liver. Electronically Signed   By: Aletta Edouard M.D.   On: 01/26/2018 13:32   Ct Cerebral Perfusion W Contrast  Result Date: 02/07/2018 CLINICAL DATA:  72 year old male with early cytotoxic edema in the left MCA territory on noncontrast head CT today performed for new aphasia and right side symptoms. EXAM: CT ANGIOGRAPHY HEAD AND NECK CT PERFUSION BRAIN TECHNIQUE: Multidetector CT imaging of the head and neck was performed using the standard protocol during bolus administration of intravenous contrast. Multiplanar CT image reconstructions and MIPs were obtained to evaluate the vascular anatomy. Carotid stenosis measurements (when applicable) are obtained utilizing NASCET criteria, using the distal internal carotid diameter as the denominator. Multiphase CT imaging of the brain  was performed following IV bolus contrast injection. Subsequent parametric perfusion maps were calculated using RAPID software. CONTRAST:  Total of 115 milliliters Omnipaque 350 COMPARISON:  Head CT without contrast 1048 hours today. FINDINGS: CT Brain Perfusion  Findings: ASPECTS 7 at 1048 hours today. CBF (<30%) Volume: 12 milliliters, which appears to overlap the cytotoxic edema detected by plain head CT about the left insula and frontal operculum. Perfusion (Tmax>6.0s) volume: 19 milliliters Mismatch Volume: 7 milliliters Infarction Location:Left MCA. CTA NECK Skeleton: Osteopenia. Mild motion artifact. No acute osseous abnormality identified. Upper chest: Upper lung motion artifact, the lungs appear clear. No superior mediastinal lymphadenopathy identified. Other neck: Mild motion artifact. No neck mass or lymphadenopathy identified. Aortic arch: Calcified aortic atherosclerosis. Three vessel arch configuration with mild to moderate motion artifact at the arch and thoracic inlet. Right carotid system: Motion artifact affecting the right CCA in the upper chest. In the neck the right CCA is patent. There is calcified and soft plaque at the medial right ICA origin and bulb resulting in less than 50 % stenosis with respect to the distal vessel. Mildly tortuous right ICA below the skull base. Left carotid system: Motion artifact affecting the left CCA in the upper chest. In the neck the left CCA is patent with minimal to mild soft and calcified plaque at the left ICA origin. No cervical left ICA stenosis. Vertebral arteries: Motion artifact affecting the proximal right subclavian artery. The right vertebral artery origin is patent and appears grossly normal. The right vertebral artery is dominant and patent to the skull base. There is mild right V3 segment calcified plaque without significant stenosis. Motion artifact affecting the proximal left subclavian artery in the upper chest. The left vertebral artery origin is patent but otherwise not well evaluated. The left vertebral artery is non dominant with intermittent V2 segment calcified plaque. The left vertebral remains patent to the skull base. CTA HEAD Posterior circulation: Dominant distal right vertebral artery.  Bilateral V4 segment calcified plaque but only mild left V4 segment stenosis. Patent vertebrobasilar junction. The a ICAs appear dominant and patent. Patent basilar artery without stenosis. The SCA and PCA origins are patent. Both P1 segments are mildly tortuous. Posterior communicating arteries are diminutive or absent. Bilateral PCA branches are within normal limits. Anterior circulation: Bilateral ICA siphon calcified plaque. Both siphons remain patent. Mild bilateral siphon stenosis. Normal ophthalmic artery origins. Patent carotid termini. Patent MCA and ACA origins with mild irregularity. Mild bilateral MCA origins stenosis. The left A2 scratched at the proximal left A2 segment is mildly ectatic. The anterior communicating artery is diminutive or absent. Bilateral ACA branches otherwise appear normal. The right MCA M1 segment, bifurcation, and right MCA branches are within normal limits. The left MCA M1 segment is patent and bifurcates early. There is no proximal M2 branch occlusion or stenosis. There is irregularity at a a left MCA branch bifurcation affecting M3 origins on series 13, image 152. It is possible there is a small M3 branch occlusion here, or more distally. Venous sinuses: Patent. Anatomic variants: Dominant right vertebral artery. Delayed phase: Stable gray-white matter differentiation throughout the brain. Patchy cytotoxic edema at the left insula and frontal operculum. No abnormal enhancement identified. Review of the MIP images confirms the above findings IMPRESSION: 1. Negative for emergent large vessel occlusion. There distal Left MCA (image 3) branch irregularity and possible occlusion. 2. CT Perfusion detects associated small Left MCA middle division core infarct with a volume of 12 mL, corresponding to the cytotoxic edema on  plain head CT today, with little additional penumbra (mismatch volume of only 7 mL). 3. Stable CT appearance of the brain since 1048 hours, no mass effect or  hemorrhage. 4. Positive intra- and extracranial atherosclerosis, although no other hemodynamically significant stenosis is identified in the head and neck. 5. Motion artifact in the upper chest and at the thoracic inlet limiting evaluation of the aortic arch and proximal great vessels. Electronically Signed   By: Genevie Ann M.D.   On: 02/07/2018 12:16   Ct Head Code Stroke Wo Contrast  Addendum Date: 02/07/2018   ADDENDUM REPORT: 02/07/2018 11:21 ADDENDUM: Study discussed by telephone with Dr. Marjean Donna on 02/07/2018 at 1112 hours. Electronically Signed   By: Genevie Ann M.D.   On: 02/07/2018 11:21   Result Date: 02/07/2018 CLINICAL DATA:  Code stroke. 72 year old male who awoke this morning with aphasia and ruling out of the right side of the mouth. Metastatic esophageal cancer. EXAM: CT HEAD WITHOUT CONTRAST TECHNIQUE: Contiguous axial images were obtained from the base of the skull through the vertex without intravenous contrast. COMPARISON:  Brain MRI 01/29/2018. FINDINGS: Brain: Patchy chronic bilateral white matter hypodensity. However, there is new cortical hypodensity along the mid insula and overlying operculum best seen on coronal image 31. The left basal ganglia and internal capsule appear have normal gray-white matter differentiation. There is subtle cortical hypodensity suspected elsewhere at the left frontal operculum on series 2, image 21. No associated hemorrhage or mass effect. No cytotoxic edema in the right hemisphere or posterior fossa identified. No ventriculomegaly. No intracranial mass lesion or mass effect. Vascular: Calcified atherosclerosis at the skull base. Skull: No suspicious intracranial vascular hyperdensity. Sinuses/Orbits: Clear. Other: Leftward gaze deviation. Visualized scalp soft tissues are within normal limits. ASPECTS [facility] Stroke Program Early CT Score) - Ganglionic level infarction (caudate, lentiform nuclei, internal capsule, insula, M1-M3 cortex): 5 (abnormal insula  and M1 segment). - Supraganglionic infarction (M4-M6 cortex): 2 (abnormal M 5 segment). Total score (0-10 with 10 being normal): 7 IMPRESSION: 1. Cytotoxic edema in the left insula and frontal operculum compatible with acute/evolving left MCA territory infarct.ASPECTS is 7 out of 10. 2. No associated hemorrhage or intracranial mass effect. 3. Underlying chronic bilateral cerebral white matter disease as seen on the recent MRI. Electronically Signed: By: Genevie Ann M.D. On: 02/07/2018 11:10    Micro Results    No results found for this or any previous visit (from the past 240 hour(s)).     Today   Subjective:   Dameir Gentzler today for discharge to hospice home and arrangements are made. Objective:   Blood pressure 122/70, pulse 86, temperature 98.2 F (36.8 C), temperature source Oral, resp. rate 18, height 5' 8"  (1.727 m), weight 56.2 kg (124 lb), SpO2 93 %.   Intake/Output Summary (Last 24 hours) at 02/09/2018 1301 Last data filed at 02/08/2018 2240 Gross per 24 hour  Intake 1674 ml  Output 400 ml  Net 1274 ml    Exam Awake Alert, Oriented x 3.  Patient has expressive aphasia.  Appears malnourished, /cachecticNo new F.N deficits, Normal affect [place].AT,PERRAL Supple Neck,No JVD, No cervical lymphadenopathy appriciated.  Symmetrical Chest wall movement, Good air movement bilaterally, CTAB RRR,No Gallops,Rubs or new Murmurs, No Parasternal Heave +ve B.Sounds, Abd Soft, Non tender, No organomegaly appriciated, No rebound -guarding or rigidity. No Cyanosis, Clubbing or edema, No new Rash or bruise  Data Review   CBC w Diff:  Lab Results  Component Value Date   WBC 8.7 02/07/2018  HGB 13.9 02/07/2018   HCT 40.1 02/07/2018   PLT 342 02/07/2018   LYMPHOPCT 10 02/07/2018   MONOPCT 2 02/07/2018   EOSPCT 0 02/07/2018   BASOPCT 1 02/07/2018    CMP:  Lab Results  Component Value Date   NA 132 (L) 02/09/2018   K 3.3 (L) 02/09/2018   CL 100 (L) 02/09/2018   CO2 26 02/09/2018    BUN 23 (H) 02/09/2018   CREATININE 0.63 02/09/2018   PROT 7.0 02/07/2018   ALBUMIN 4.3 02/07/2018   BILITOT 1.5 (H) 02/07/2018   ALKPHOS 210 (H) 02/07/2018   AST 54 (H) 02/07/2018   ALT 41 02/07/2018  .   Total Time in preparing paper work, data evaluation and todays exam - 35 minutes  Epifanio Lesches M.D on 02/09/2018 at 1:01 PM    Note: This dictation was prepared with Dragon dictation along with smaller phrase technology. Any transcriptional errors that result from this process are unintentional.

## 2018-02-09 NOTE — Progress Notes (Signed)
SLP Cancellation Note  Patient Details Name: Evan Mason MRN: [id number] DOB: 06-Oct-1946   Cancelled treatment:       Reason Eval/Treat Not Completed: Fatigue/lethargy limiting ability to participate(chart reviewed; Palliative Care meeting w/ pt). Goals of care being established today. Noted per pt/family earlier this morning he had been taking few sips of liquids for pleasure. Pt immediately put his head down to rest. He did not verbally respond to SLP but smiled and nodded head to few questions; family present. SLP notes the expressive language issues and will offer f/u post discharge to address if pt so chooses. Will await goals of care from Palliative Care team meeting.    Orinda Kenner, MS, CCC-SLP Taiquan Campanaro 02/09/2018, 6:13 PM

## 2018-02-09 NOTE — Clinical Social Work Note (Signed)
Clinical Social Work Assessment  Patient Details  Name: Evan Mason MRN: [id number] Date of Birth: 02-May-1946  Date of referral:  02/09/18               Reason for consult:  Discharge Planning                Permission sought to share information with:    Permission granted to share information::     Name::        Agency::     Relationship::     Contact Information:     Housing/Transportation Living arrangements for the past 2 months:  Single Family Home Source of Information:  Adult Children Patient Interpreter Needed:  None Criminal Activity/Legal Involvement Pertinent to Current Situation/Hospitalization:  No - Comment as needed Significant Relationships:  Adult Children Lives with:  Significant Other Do you feel safe going back to the place where you live?  (n/a) Need for family participation in patient care:  Yes (Comment)  Care giving concerns:  Lives with significant other.   Social Worker assessment / plan: CSW informed by Palliative Care this afternoon that family has chosen hospice home at this time. CSW spoke with patient's daughter: Bing Plume: [phone] this afternoon and she confirmed this plan. CSW offered choice of hospice home and she chose [facility]. CSW made referral to [place] with [facility].  Employment status:  Retired Nurse, adult PT Recommendations:    Information / Referral to community resources:     Patient/Family's Response to care:  Patient's daughter expressed appreciation for CSW visit.  Patient/Family's Understanding of and Emotional Response to Diagnosis, Current Treatment, and Prognosis:  Patient's daughter and family are attempting to adjust to the new prognosis for patient and believe hospice home is the best option for patient at this time.   Emotional Assessment Appearance:  Appears older than stated age Attitude/Demeanor/Rapport:  (currently sleeping at time of  assessment) Affect (typically observed):    Orientation:    Alcohol / Substance use:  Not Applicable Psych involvement (Current and /or in the community):  No (Comment)  Discharge Needs  Concerns to be addressed:  Care Coordination Readmission within the last 30 days:  No Current discharge risk:  None Barriers to Discharge:  No Barriers Identified   Shela Leff, LCSW 02/09/2018, 4:35 PM

## 2018-02-10 ENCOUNTER — Ambulatory Visit: Payer: Medicare HMO | Admitting: Oncology

## 2018-02-10 ENCOUNTER — Ambulatory Visit: Admission: RE | Admit: 2018-02-10 | Payer: Medicare HMO | Source: Ambulatory Visit

## 2018-02-10 ENCOUNTER — Other Ambulatory Visit: Payer: Medicare HMO

## 2018-02-10 DIAGNOSIS — G893 Neoplasm related pain (acute) (chronic): Secondary | ICD-10-CM

## 2018-02-10 DIAGNOSIS — Z66 Do not resuscitate: Secondary | ICD-10-CM

## 2018-02-10 DIAGNOSIS — Z515 Encounter for palliative care: Secondary | ICD-10-CM

## 2018-02-10 DIAGNOSIS — C787 Secondary malignant neoplasm of liver and intrahepatic bile duct: Secondary | ICD-10-CM

## 2018-02-10 NOTE — Progress Notes (Signed)
Pt. Transfer to hospice home in care of EMS services

## 2018-02-10 NOTE — Plan of Care (Signed)
Transfer to hospice level of care.

## 2018-02-10 NOTE — Care Management Important Message (Signed)
Important Message  Patient Details  Name: Evan Mason MRN: [id number] Date of Birth: 09-17-1946   Medicare Important Message Given:  Yes    Juliann Pulse A [name] 02/10/2018, 10:46 AM

## 2018-02-10 NOTE — Progress Notes (Signed)
Follow up visit made to new hospice home referral. Patient seen sitting up in bed alert, daughter Colletta [place] at bedside. Patient oriented and now able to say some words. He reports understanding of discharge plan and is in agreement. Tube feedings discussed, patient reports no feeling of satisfaction with feeding. He is able to drink liquids and was sipping coffee during visit. Emotional support provided. Plan to transfer to the Hospice home via EMS this morning. Signed DNR in place in patient's chart. Will continue to follow through discharge. Flo Shanks RN, BSN, [place] [facility], hospital  Liaison [phone]

## 2018-02-10 NOTE — Clinical Social Work Note (Signed)
CSW has coordinated with Santiago Glad with [facility] and patient will be discharging to the hospice home today. Shela Leff MSW,LCSW [phone]

## 2018-02-10 NOTE — Progress Notes (Signed)
Daily Progress Note   Patient Name: Evan Mason       Date: 02/10/2018 DOB: 1946/01/26  Age: 72 y.o. MRN#: [id number] Attending Physician: Epifanio Lesches, MD Primary Care Physician: Kirk Ruths, MD Admit Date: 02/07/2018  Reason for Consultation/Follow-up: Establishing goals of care and Psychosocial/spiritual support  Subjective: Patient is alert and awake in bed. Family is at bedside. Patient denies pain or discomfort. Family verbalized meeting with Santiago Glad, Hospice Liaison on yesterday evening and making arrangements to transfer to Hospice facility today. Reviewed goals of care and expectations. Patient present during discussion and kept eyes closed while family (daughter Colletta [place] and husband, and his brother) did 56 of the talking. Family verbalized awareness of Hospice facilities goals and appreciative that they align with their goals for Mr. Brockway's EOL care. Patient awakened and asked how he felt regarding the conversation and decisions that were made over the past 24hrs. He replied he was fine with plans and then closed his eyes and turned head. Family reports he is still processing the information but did  verbalized yesterday and this morning he was at peace with their decisions.   I also spoke with Dr. Tasia Catchings (Oncologist) as family requested to notify her regarding their decisions. Dr. Tasia Catchings agreed with the patient and family's decision, noted that it was very much appropriate. She reported he had received his initial chemotherapy treatment with knowing that expectations were only palliative and knowing he was starting treatment in not the best state of health mentally or physically.    Chart Reviewed.   Length of Stay: 3  Current Medications: Scheduled Meds:  .  aspirin  325 mg Per Tube Daily  . chlorhexidine  15 mL Mouth Rinse BID  . enoxaparin (LOVENOX) injection  40 mg Subcutaneous q1800  . feeding supplement (OSMOLITE 1.5 CAL)  237 mL Per Tube 5 X Daily  . fentaNYL  25 mcg Transdermal Q72H  . free water  180 mL Per Tube 5 X Daily  . mouth rinse  15 mL Mouth Rinse q12n4p  . rosuvastatin  20 mg Per Tube q1800    Continuous Infusions: . sodium chloride 75 mL/hr at 02/09/18 1940    PRN Meds: acetaminophen **OR** acetaminophen (TYLENOL) oral liquid 160 mg/5 mL **OR** acetaminophen, bisacodyl, LORazepam, ondansetron (ZOFRAN) IV, senna-docusate, sodium chloride flush  Physical  Exam   Constitutional: He is oriented to person, place, and time. He appears cachectic. He appears ill.  Cardiovascular: Normal rate, regular rhythm, normal heart sounds and intact distal pulses.  Pulmonary/Chest: Effort normal. He has decreased breath sounds.  Abdominal: Soft. Bowel sounds are normal.  PEG tube   Musculoskeletal:  Generalized weakness   Neurological: He is alert and oriented to person, place, and time.  Psychiatric: He has a normal mood and affect.  Able to communicate with mouthing words and pen/paper  Nursing note and vitals reviewed.  Vital Signs: BP 124/74 (BP Location: Right Arm)   Pulse 90   Temp 98.6 F (37 C) (Oral)   Resp 18   Ht 5\' 8"  (1.727 m)   Wt 56.2 kg (124 lb)   SpO2 96%   BMI 18.85 kg/m  SpO2: SpO2: 96 % O2 Device: O2 Device: Room Air O2 Flow Rate:    Intake/output summary:   Intake/Output Summary (Last 24 hours) at 02/10/2018 1036 Last data filed at 02/10/2018 2836 Gross per 24 hour  Intake 3581.5 ml  Output -  Net 3581.5 ml   LBM: Last BM Date: 02/10/18 Baseline Weight: Weight: 56.2 kg (124 lb) Most recent weight: Weight: 56.2 kg (124 lb)       Palliative Assessment/Data: PPS 30%     Patient Active Problem List   Diagnosis Date Noted  . Acute CVA (cerebrovascular accident) ([place]) 02/07/2018  . Severe  protein-calorie malnutrition ([place]) 01/30/2018  . Esophageal cancer, stage IV ([place]) 01/30/2018  . Goals of care, counseling/discussion 01/29/2018  . Hypertension 01/25/2018  . Esophageal mass 01/25/2018  . Hyperglycemia, unspecified 11/07/2016  . Health care maintenance 10/24/2015  . Erectile dysfunction due to arterial insufficiency 06/21/2015  . History of nonmelanoma skin cancer 02/18/2012  . Dyslipidemia 05/13/2011  . Tobacco use 05/13/2011    Palliative Care Assessment & Plan   Patient Profile: 72 y.o. male admitted on 02/07/2018 from home with right facial droop and aphasia according to family. 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 He received his first chemotherapy treatment of FOLFOX on 02/03/18 and was scheduled to begin radiation therapy on 02/08/18. He had a PEG tube placed on 01/29/18 for nutritional support. Prior to his cancer diagnosis family reported patient had no prior health conditions. He had a recent MRI of the brain that did not show any brain metastasis.  He has no prior history of a stroke or other vascular risk factors according to his family.  CT head without contrast showed early ischemic changes in the left MCA distribution with.  He was seen by neurology and deemed not to be a TPA candidate. Palliative medicine team consulted for goals of care discussion.  Recommendations/Plan:  DNR/DNI (DNR/MOST form completed)   Per Santiago Glad, [facility] Liaison patient has an available bed at [facility] facility and will be transferred today. Family is at bedside and aware. They are comfortable with this decision and aware of expected care at the facility.   Agree with attending order for fentanyl patch for pain, Ativan as needed for anxiety, and Zofran as needed for nausea.  Would consider Roxanol for breakthrough pain.   Scopolamine patch and Robinul for secretions.   Palliative to continue to support  patient, family, and medical team during hospitalizations.  Goals of Care and Additional Recommendations:  Limitations on Scope of Treatment: Full Scope Treatment family request to continue to treat the treatable while hospitalized.   Code  Status:    Code Status Orders  (From admission, onward)        Start     Ordered   02/07/18 1359  Do not attempt resuscitation (DNR)  Continuous    Question Answer Comment  In the event of cardiac or respiratory ARREST Do not call a "code blue"   In the event of cardiac or respiratory ARREST Do not perform Intubation, CPR, defibrillation or ACLS   In the event of cardiac or respiratory ARREST Use medication by any route, position, wound care, and other measures to relive pain and suffering. May use oxygen, suction and manual treatment of airway obstruction as needed for comfort.      02/07/18 1358    Code Status History    This patient has a current code status but no historical code status.      Prognosis:   < 4 weeks in the setting or Stage IV metastatic esophageal cancer, discontinuation of chemotherapy/radiation therapy treatments, malnutrition, acute CVA, and immobility.  Discharge Planning:  Hospice facility  Care plan was discussed with patient, family, bedside RN, Santiago Glad, RN [facility]), and Dr. Vianne Bulls.   Thank you for allowing the Palliative Medicine Team to assist in the care of this patient.   Total Time 30 min  Prolonged Time Billed  No       Greater than 50%  of this time was spent counseling and coordinating care related to the above assessment and plan.  Alda Lea, NP  Please contact Palliative Medicine Team phone at [phone] for questions and concerns.

## 2018-02-11 ENCOUNTER — Ambulatory Visit: Payer: Medicare HMO

## 2018-02-11 DIAGNOSIS — Z66 Do not resuscitate: Secondary | ICD-10-CM

## 2018-02-11 DIAGNOSIS — G893 Neoplasm related pain (acute) (chronic): Secondary | ICD-10-CM

## 2018-02-11 DIAGNOSIS — Z515 Encounter for palliative care: Secondary | ICD-10-CM

## 2018-02-11 DIAGNOSIS — C787 Secondary malignant neoplasm of liver and intrahepatic bile duct: Secondary | ICD-10-CM

## 2018-02-12 ENCOUNTER — Ambulatory Visit: Payer: Medicare HMO

## 2018-02-15 ENCOUNTER — Ambulatory Visit: Payer: Medicare HMO

## 2018-02-16 ENCOUNTER — Ambulatory Visit: Payer: Medicare HMO

## 2018-02-17 ENCOUNTER — Ambulatory Visit: Payer: Medicare HMO | Admitting: Oncology

## 2018-02-17 ENCOUNTER — Other Ambulatory Visit: Payer: Medicare HMO

## 2018-02-17 ENCOUNTER — Ambulatory Visit: Payer: Medicare HMO

## 2018-02-17 ENCOUNTER — Inpatient Hospital Stay: Payer: Medicare HMO

## 2018-02-18 ENCOUNTER — Ambulatory Visit: Payer: Medicare HMO

## 2018-02-19 ENCOUNTER — Inpatient Hospital Stay: Payer: Medicare HMO

## 2018-02-19 ENCOUNTER — Ambulatory Visit: Payer: Medicare HMO

## 2018-02-22 ENCOUNTER — Inpatient Hospital Stay: Payer: Medicare HMO

## 2018-02-22 ENCOUNTER — Ambulatory Visit: Payer: Medicare HMO

## 2018-02-23 ENCOUNTER — Ambulatory Visit: Payer: Medicare HMO

## 2018-02-24 ENCOUNTER — Ambulatory Visit: Payer: Medicare HMO

## 2018-02-25 ENCOUNTER — Ambulatory Visit: Payer: Medicare HMO

## 2018-02-26 ENCOUNTER — Ambulatory Visit: Payer: Medicare HMO

## 2018-03-01 ENCOUNTER — Ambulatory Visit: Payer: Medicare HMO

## 2018-03-02 ENCOUNTER — Ambulatory Visit: Payer: Medicare HMO

## 2018-03-03 ENCOUNTER — Ambulatory Visit: Payer: Medicare HMO

## 2018-03-04 ENCOUNTER — Ambulatory Visit: Payer: Medicare HMO

## 2018-03-05 ENCOUNTER — Ambulatory Visit: Payer: Medicare HMO

## 2018-03-08 ENCOUNTER — Ambulatory Visit: Payer: Medicare HMO

## 2018-03-09 ENCOUNTER — Ambulatory Visit: Payer: Medicare HMO

## 2018-03-10 ENCOUNTER — Ambulatory Visit: Payer: Medicare HMO

## 2018-03-11 ENCOUNTER — Ambulatory Visit: Payer: Medicare HMO

## 2018-03-12 ENCOUNTER — Ambulatory Visit: Payer: Medicare HMO

## 2018-03-13 ENCOUNTER — Ambulatory Visit: Payer: Medicare HMO

## 2018-03-15 ENCOUNTER — Ambulatory Visit: Payer: Medicare HMO

## 2018-03-16 ENCOUNTER — Ambulatory Visit: Payer: Medicare HMO

## 2018-03-17 ENCOUNTER — Ambulatory Visit: Payer: Medicare HMO

## 2018-03-18 ENCOUNTER — Ambulatory Visit: Payer: Medicare HMO

## 2018-03-19 ENCOUNTER — Ambulatory Visit: Payer: Medicare HMO

## 2018-04-26 DEATH — deceased

## 2018-05-13 ENCOUNTER — Encounter: Payer: Self-pay | Admitting: Oncology

## 2018-07-31 IMAGING — CR DG ORBITS FOR FOREIGN BODY
2 series · 2 of 2 positions shown · non-contrast
Comparison: None.

CLINICAL DATA: Metal working/exposure; clearance prior to MRI

EXAM:
ORBITS FOR FOREIGN BODY - 2 VIEW

[orbits waters (1 of 2)]
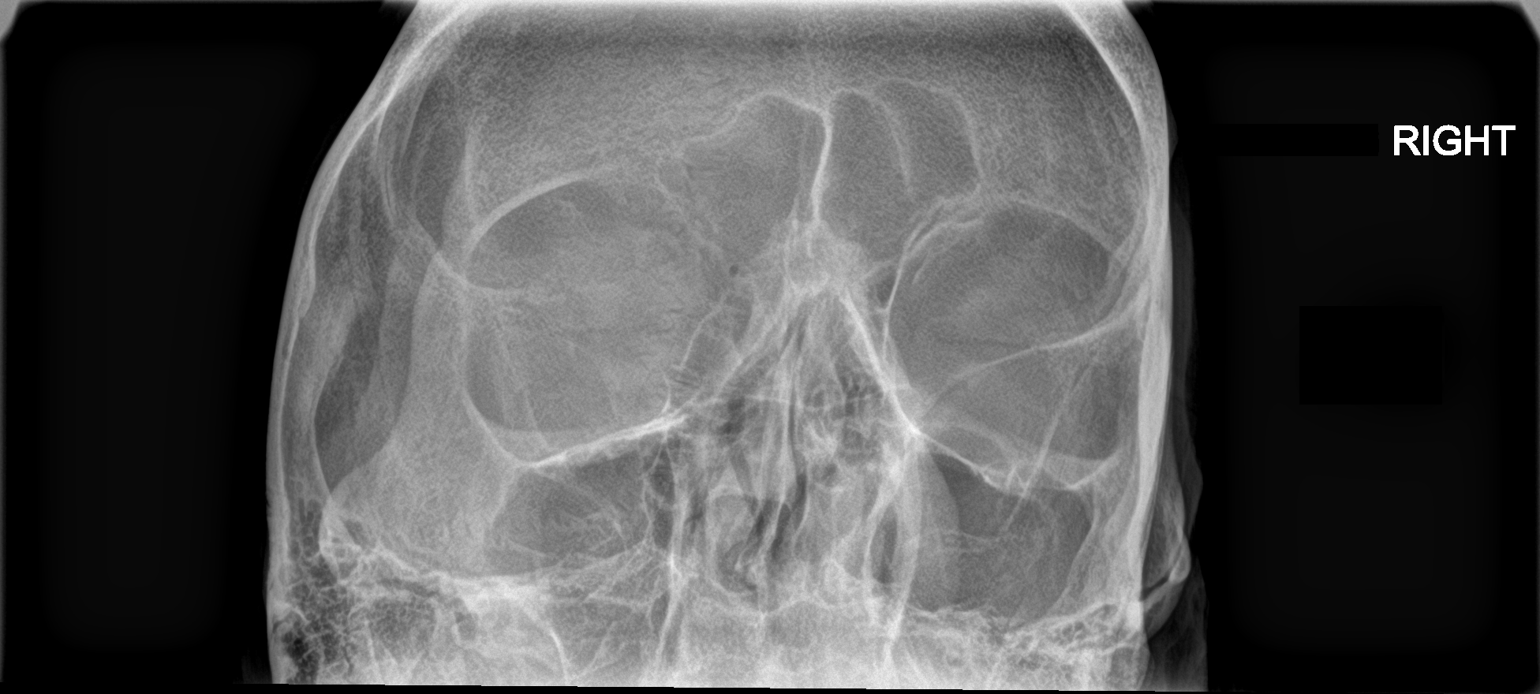

[orbits waters (2 of 2)]
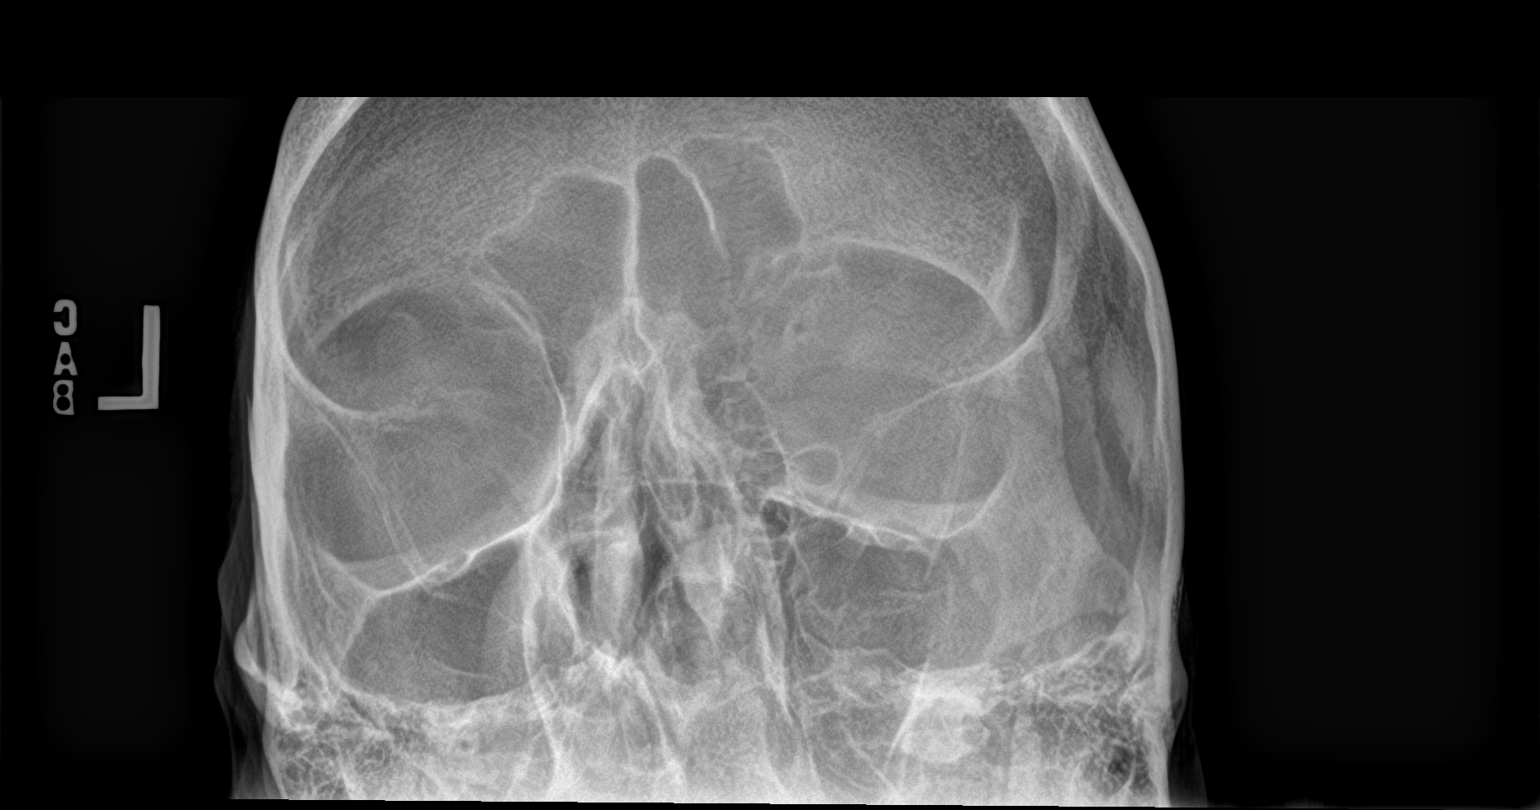

[2 of 2 positions shown; findings below may reference images not displayed]

FINDINGS: There is no evidence of metallic foreign body within the orbits. No
significant bone abnormality identified.
IMPRESSION: No evidence of metallic foreign body within the orbits.
